# Patient Record
Sex: Female | Born: 1980 | Hispanic: No
Health system: Southern US, Community
[De-identification: ages and names within clinical notes are randomized; demographics above are authoritative.]

## PROBLEM LIST (undated history)

## (undated) DIAGNOSIS — R519 Headache, unspecified: Secondary | ICD-10-CM

## (undated) DIAGNOSIS — R51 Headache: Secondary | ICD-10-CM

## (undated) DIAGNOSIS — O24419 Gestational diabetes mellitus in pregnancy, unspecified control: Secondary | ICD-10-CM

## (undated) HISTORY — PX: APPENDECTOMY: SHX54

---

## 2014-11-07 ENCOUNTER — Emergency Department: Payer: Medicaid - Out of State

## 2014-11-07 ENCOUNTER — Encounter: Payer: Self-pay | Admitting: Medical Oncology

## 2014-11-07 ENCOUNTER — Emergency Department
Admission: EM | Admit: 2014-11-07 | Discharge: 2014-11-07 | Disposition: A | Discharge status: Home / Self Care | Payer: Medicaid - Out of State | Attending: Emergency Medicine | Admitting: Emergency Medicine

## 2014-11-07 DIAGNOSIS — R0600 Dyspnea, unspecified: Secondary | ICD-10-CM | POA: Insufficient documentation

## 2014-11-07 DIAGNOSIS — R1110 Vomiting, unspecified: Secondary | ICD-10-CM | POA: Insufficient documentation

## 2014-11-07 DIAGNOSIS — N3090 Cystitis, unspecified without hematuria: Secondary | ICD-10-CM | POA: Insufficient documentation

## 2014-11-07 DIAGNOSIS — R109 Unspecified abdominal pain: Secondary | ICD-10-CM | POA: Insufficient documentation

## 2014-11-07 DIAGNOSIS — R42 Dizziness and giddiness: Secondary | ICD-10-CM | POA: Diagnosis present

## 2014-11-07 DIAGNOSIS — Z3202 Encounter for pregnancy test, result negative: Secondary | ICD-10-CM | POA: Diagnosis not present

## 2014-11-07 LAB — CBC
HEMATOCRIT: 36.1 % (ref 35.0–47.0)
Hemoglobin: 11.9 g/dL — ABNORMAL LOW (ref 12.0–16.0)
MCH: 25.8 pg — ABNORMAL LOW (ref 26.0–34.0)
MCHC: 33.1 g/dL (ref 32.0–36.0)
MCV: 78.1 fL — ABNORMAL LOW (ref 80.0–100.0)
Platelets: 270 10*3/uL (ref 150–440)
RBC: 4.62 MIL/uL (ref 3.80–5.20)
RDW: 14.8 % — AB (ref 11.5–14.5)
WBC: 7.9 10*3/uL (ref 3.6–11.0)

## 2014-11-07 LAB — URINALYSIS COMPLETE WITH MICROSCOPIC (ARMC ONLY)
Bilirubin Urine: NEGATIVE
GLUCOSE, UA: NEGATIVE mg/dL
Ketones, ur: NEGATIVE mg/dL
LEUKOCYTES UA: NEGATIVE
NITRITE: NEGATIVE
PH: 6 (ref 5.0–8.0)
PROTEIN: NEGATIVE mg/dL
Specific Gravity, Urine: 1.016 (ref 1.005–1.030)

## 2014-11-07 LAB — COMPREHENSIVE METABOLIC PANEL
ALT: 13 U/L — ABNORMAL LOW (ref 14–54)
ANION GAP: 7 (ref 5–15)
AST: 20 U/L (ref 15–41)
Albumin: 4.1 g/dL (ref 3.5–5.0)
Alkaline Phosphatase: 64 U/L (ref 38–126)
BILIRUBIN TOTAL: 0.6 mg/dL (ref 0.3–1.2)
BUN: 12 mg/dL (ref 6–20)
CALCIUM: 9 mg/dL (ref 8.9–10.3)
CO2: 24 mmol/L (ref 22–32)
Chloride: 108 mmol/L (ref 101–111)
Creatinine, Ser: 0.53 mg/dL (ref 0.44–1.00)
GFR calc Af Amer: >60 mL/min (ref >60)
Glucose, Bld: 76 mg/dL (ref 65–99)
POTASSIUM: 4.5 mmol/L (ref 3.5–5.1)
Sodium: 139 mmol/L (ref 135–145)
TOTAL PROTEIN: 7.4 g/dL (ref 6.5–8.1)

## 2014-11-07 LAB — POCT PREGNANCY, URINE: PREG TEST UR: NEGATIVE

## 2014-11-07 LAB — LIPASE, BLOOD: Lipase: 27 U/L (ref 22–51)

## 2014-11-07 MED ORDER — PHENAZOPYRIDINE HCL 200 MG PO TABS
ORAL_TABLET | ORAL | Status: AC
  Administered 2014-11-07: 200 mg via ORAL
  Filled 2014-11-07: qty 1

## 2014-11-07 MED ORDER — PHENAZOPYRIDINE HCL 200 MG PO TABS: 200.0000 mg | ORAL_TABLET | Freq: Three times a day (TID) | ORAL | Status: AC | PRN

## 2014-11-07 MED ORDER — PHENAZOPYRIDINE HCL 200 MG PO TABS
200.0000 mg | ORAL_TABLET | Freq: Once | ORAL | Status: AC
  Administered 2014-11-07: 200 mg via ORAL

## 2014-11-07 MED ORDER — SULFAMETHOXAZOLE-TRIMETHOPRIM 800-160 MG PO TABS: 1.0000 | ORAL_TABLET | Freq: Two times a day (BID) | ORAL | Status: DC

## 2014-11-07 MED ORDER — OXYCODONE-ACETAMINOPHEN 5-325 MG PO TABS
2.0000 | ORAL_TABLET | Freq: Once | ORAL | Status: AC
  Administered 2014-11-07: 2 via ORAL
  Filled 2014-11-07: qty 2

## 2014-11-07 MED ORDER — PHENAZOPYRIDINE HCL 200 MG PO TABS
ORAL_TABLET | ORAL | Status: AC
  Filled 2014-11-07: qty 1

## 2014-11-07 MED ORDER — ONDANSETRON HCL 4 MG PO TABS
4.0000 mg | ORAL_TABLET | Freq: Once | ORAL | Status: AC
  Administered 2014-11-07: 4 mg via ORAL
  Filled 2014-11-07 (×2): qty 1

## 2014-11-07 NOTE — ED Notes (Signed)
Pt to triage with reports of lower abd pain, dizziness and vomiting that began yesterday. Pt also reports dysuria.

## 2014-11-07 NOTE — ED Provider Notes (Signed)
[facility] Emergency Department Provider Note     Time seen: ----------------------------------------- 7:31 PM on 11/07/2014 -----------------------------------------    I have reviewed the triage vital signs and the nursing notes.   HISTORY  Chief Complaint Dizziness and Emesis    HPI Stephanie Fischer is a 34 y.o. female who presents ER for lower abdominal pain, dizziness and vomiting that began yesterday. She also reports dysuria and inability to get her urine out. She denies fevers chills or other complaints.   History reviewed. No pertinent past medical history.  There are no active problems to display for this patient.   Past Surgical History  Procedure Laterality Date  . Appendectomy    . Cesarean section      Allergies Review of patient's allergies indicates no known allergies.  Social History Social History  Substance Use Topics  . Smoking status: Never Smoker   . Smokeless tobacco: None  . Alcohol Use: No    Review of Systems Constitutional: Negative for fever. Eyes: Negative for visual changes. ENT: Negative for sore throat. Cardiovascular: Negative for chest pain. Respiratory: Negative for shortness of breath. Gastrointestinal: Positive for abdominal pain, positive for vomiting Genitourinary: Positive for dysuria Musculoskeletal: Negative for back pain. Skin: Negative for rash. Neurological: Negative for headaches, focal weakness or numbness. Positive for dizziness  10-point ROS otherwise negative.  ____________________________________________   PHYSICAL EXAM:  VITAL SIGNS: ED Triage Vitals  Enc Vitals Group     BP 11/07/14 1830 113/75 mmHg     Pulse Rate 11/07/14 1830 96     Resp 11/07/14 1830 18     Temp 11/07/14 1830 97.8 F (36.6 C)     Temp Source 11/07/14 1830 Oral     SpO2 11/07/14 1830 97 %     Weight 11/07/14 1830 150 lb (68.04 kg)     Height 11/07/14 1830 5\' 2"  (1.575 m)     Head Cir --    Peak Flow --      Pain Score 11/07/14 1831 9     Pain Loc --      Pain Edu? --      Excl. in [place]? --     Constitutional: Alert and oriented. Mild distress Eyes: Conjunctivae are normal. PERRL. Normal extraocular movements. ENT   Head: Normocephalic and atraumatic.   Nose: No congestion/rhinnorhea.   Mouth/Throat: Mucous membranes are moist.   Neck: No stridor. Cardiovascular: Normal rate, regular rhythm. Normal and symmetric distal pulses are present in all extremities. No murmurs, rubs, or gallops. Respiratory: Normal respiratory effort without tachypnea nor retractions. Breath sounds are clear and equal bilaterally. No wheezes/rales/rhonchi. Gastrointestinal: Nonfocal tenderness, no rebound or guarding. Normal bowel sounds. Musculoskeletal: Nontender with normal range of motion in all extremities. No joint effusions.  No lower extremity tenderness nor edema. Neurologic:  Normal speech and language. No gross focal neurologic deficits are appreciated. Speech is normal. No gait instability. Skin:  Skin is warm, dry and intact. No rash noted. Psychiatric: Mood and affect are normal. Speech and behavior are normal. Patient exhibits appropriate insight and judgment.  ____________________________________________  ED COURSE:  Pertinent labs & imaging results that were available during my care of the patient were reviewed by me and considered in my medical decision making (see chart for details). Unclear etiology, will check abdominal labs and urine. ____________________________________________    LABS (pertinent positives/negatives)  Labs Reviewed  COMPREHENSIVE METABOLIC PANEL - Abnormal; Notable for the following:    ALT 13 (*)    All  other components within normal limits  CBC - Abnormal; Notable for the following:    Hemoglobin 11.9 (*)    MCV 78.1 (*)    MCH 25.8 (*)    RDW 14.8 (*)    All other components within normal limits  URINALYSIS COMPLETEWITH MICROSCOPIC  ([facility] ONLY) - Abnormal; Notable for the following:    Color, Urine STRAW (*)    APPearance CLEAR (*)    Hgb urine dipstick 2+ (*)    Bacteria, UA RARE (*)    Squamous Epithelial / LPF 0-5 (*)    All other components within normal limits  LIPASE, BLOOD  POC URINE PREG, ED  POCT PREGNANCY, URINE   IMPRESSION: No acute intra-abdominal or pelvic pathology. ____________________________________________  FINAL ASSESSMENT AND PLAN  Abdominal pain  Plan: Patient with labs and imaging as dictated above. Patient is in no acute distress, unclear etiology. Patient apparently having bladder spasms. Will discharge with a few days of Septra , send a urine culture and give Pyridium. She is stable for discharge   Earleen Newport, MD   Earleen Newport, MD 11/07/14 2131

## 2014-11-07 NOTE — ED Notes (Signed)
Bladder sca: 212 mL, Dr. Jimmye Norman notified.

## 2014-11-07 NOTE — Discharge Instructions (Signed)
Dizziness °Dizziness is a common problem. It is a feeling of unsteadiness or light-headedness. You may feel like you are about to faint. Dizziness can lead to injury if you stumble or fall. A person of any age group can suffer from dizziness, but dizziness is more common in older adults. °CAUSES  °Dizziness can be caused by many different things, including: °· Middle ear problems. °· Standing for too long. °· Infections. °· An allergic reaction. °· Aging. °· An emotional response to something, such as the sight of blood. °· Side effects of medicines. °· Tiredness. °· Problems with circulation or blood pressure. °· Excessive use of alcohol or medicines, or illegal drug use. °· Breathing too fast (hyperventilation). °· An irregular heart rhythm (arrhythmia). °· A low red blood cell count (anemia). °· Pregnancy. °· Vomiting, diarrhea, fever, or other illnesses that cause body fluid loss (dehydration). °· Diseases or conditions such as Parkinson's disease, high blood pressure (hypertension), diabetes, and thyroid problems. °· Exposure to extreme heat. °DIAGNOSIS  °Your health care provider will ask about your symptoms, perform a physical exam, and perform an electrocardiogram (ECG) to record the electrical activity of your heart. Your health care provider may also perform other heart or blood tests to determine the cause of your dizziness. These may include: °· Transthoracic echocardiogram (TTE). During echocardiography, sound waves are used to evaluate how blood flows through your heart. °· Transesophageal echocardiogram (TEE). °· Cardiac monitoring. This allows your health care provider to monitor your heart rate and rhythm in real time. °· Holter monitor. This is a portable device that records your heartbeat and can help diagnose heart arrhythmias. It allows your health care provider to track your heart activity for several days if needed. °· Stress tests by exercise or by giving medicine that makes the heart beat  faster. °TREATMENT  °Treatment of dizziness depends on the cause of your symptoms and can vary greatly. °HOME CARE INSTRUCTIONS  °· Drink enough fluids to keep your urine clear or pale yellow. This is especially important in very hot weather. In older adults, it is also important in cold weather. °· Take your medicine exactly as directed if your dizziness is caused by medicines. When taking blood pressure medicines, it is especially important to get up slowly. °· Rise slowly from chairs and steady yourself until you feel okay. °· In the morning, first sit up on the side of the bed. When you feel okay, stand slowly while holding onto something until you know your balance is fine. °· Move your legs often if you need to stand in one place for a long time. Tighten and relax your muscles in your legs while standing. °· Have someone stay with you for 1-2 days if dizziness continues to be a problem. Do this until you feel you are well enough to stay alone. Have the person call your health care provider if he or she notices changes in you that are concerning. °· Do not drive or use heavy machinery if you feel dizzy. °· Do not drink alcohol. °SEEK IMMEDIATE MEDICAL CARE IF:  °· Your dizziness or light-headedness gets worse. °· You feel nauseous or vomit. °· You have problems talking, walking, or using your arms, hands, or legs. °· You feel weak. °· You are not thinking clearly or you have trouble forming sentences. It may take a friend or family member to notice this. °· You have chest pain, abdominal pain, shortness of breath, or sweating. °· Your vision changes. °· You notice   any bleeding.  You have side effects from medicine that seems to be getting worse rather than better. MAKE SURE YOU:   Understand these instructions.  Will watch your condition.  Will get help right away if you are not doing well or get worse. Document Released: 08/12/2000 Document Revised: 02/21/2013 Document Reviewed: 09/05/2010 [facility]  Patient Information 2015 [place], [place]. This information is not intended to replace advice given to you by your health care provider. Make sure you discuss any questions you have with your health care provider.  Urinary Tract Infection Urinary tract infections (UTIs) can develop anywhere along your urinary tract. Your urinary tract is your body's drainage system for removing wastes and extra water. Your urinary tract includes two kidneys, two ureters, a bladder, and a urethra. Your kidneys are a pair of bean-shaped organs. Each kidney is about the size of your fist. They are located below your ribs, one on each side of your spine. CAUSES Infections are caused by microbes, which are microscopic organisms, including fungi, viruses, and bacteria. These organisms are so small that they can only be seen through a microscope. Bacteria are the microbes that most commonly cause UTIs. SYMPTOMS  Symptoms of UTIs may vary by age and gender of the patient and by the location of the infection. Symptoms in young women typically include a frequent and intense urge to urinate and a painful, burning feeling in the bladder or urethra during urination. Older women and men are more likely to be tired, shaky, and weak and have muscle aches and abdominal pain. A fever may mean the infection is in your kidneys. Other symptoms of a kidney infection include pain in your back or sides below the ribs, nausea, and vomiting. DIAGNOSIS To diagnose a UTI, your caregiver will ask you about your symptoms. Your caregiver also will ask to provide a urine sample. The urine sample will be tested for bacteria and white blood cells. White blood cells are made by your body to help fight infection. TREATMENT  Typically, UTIs can be treated with medication. Because most UTIs are caused by a bacterial infection, they usually can be treated with the use of antibiotics. The choice of antibiotic and length of treatment depend on your symptoms and  the type of bacteria causing your infection. HOME CARE INSTRUCTIONS  If you were prescribed antibiotics, take them exactly as your caregiver instructs you. Finish the medication even if you feel better after you have only taken some of the medication.  Drink enough water and fluids to keep your urine clear or pale yellow.  Avoid caffeine, tea, and carbonated beverages. They tend to irritate your bladder.  Empty your bladder often. Avoid holding urine for long periods of time.  Empty your bladder before and after sexual intercourse.  After a bowel movement, women should cleanse from front to back. Use each tissue only once. SEEK MEDICAL CARE IF:   You have back pain.  You develop a fever.  Your symptoms do not begin to resolve within 3 days. SEEK IMMEDIATE MEDICAL CARE IF:   You have severe back pain or lower abdominal pain.  You develop chills.  You have nausea or vomiting.  You have continued burning or discomfort with urination. MAKE SURE YOU:   Understand these instructions.  Will watch your condition.  Will get help right away if you are not doing well or get worse. Document Released: 11/26/2004 Document Revised: 08/18/2011 Document Reviewed: 03/27/2011 [facility] Patient Information 2015 [place], [place]. This information is not  intended to replace advice given to you by your health care provider. Make sure you discuss any questions you have with your health care provider. ° °

## 2014-11-07 NOTE — ED Notes (Signed)

## 2014-11-07 NOTE — ED Notes (Signed)
Patient presents to ED with c/o headache, dizziness, vomiting and lower abdominal pain. Reports dysuria and hematuria. + photosensitivity. Patient has history of headaches but states "this one is worse." Patient alert and oriented x 4, respirations even and unlabored.

## 2016-07-31 ENCOUNTER — Encounter: Payer: Self-pay | Admitting: *Deleted

## 2016-07-31 ENCOUNTER — Emergency Department
Admission: EM | Admit: 2016-07-31 | Discharge: 2016-07-31 | Disposition: A | Discharge status: Home / Self Care | Payer: Medicaid - Out of State | Attending: Emergency Medicine | Admitting: Emergency Medicine

## 2016-07-31 ENCOUNTER — Emergency Department: Payer: Medicaid - Out of State

## 2016-07-31 DIAGNOSIS — R51 Headache: Secondary | ICD-10-CM | POA: Insufficient documentation

## 2016-07-31 DIAGNOSIS — R519 Headache, unspecified: Secondary | ICD-10-CM

## 2016-07-31 HISTORY — DX: Headache: R51

## 2016-07-31 HISTORY — DX: Headache, unspecified: R51.9

## 2016-07-31 LAB — CBC WITH DIFFERENTIAL/PLATELET
BASOS ABS: 0 10*3/uL (ref 0–0.1)
BASOS PCT: 1 %
Eosinophils Absolute: 0.1 10*3/uL (ref 0–0.7)
Eosinophils Relative: 1 %
HEMATOCRIT: 35.6 % (ref 35.0–47.0)
Hemoglobin: 11.7 g/dL — ABNORMAL LOW (ref 12.0–16.0)
LYMPHS PCT: 31 %
Lymphs Abs: 2.5 10*3/uL (ref 1.0–3.6)
MCH: 25.5 pg — ABNORMAL LOW (ref 26.0–34.0)
MCHC: 32.9 g/dL (ref 32.0–36.0)
MCV: 77.4 fL — AB (ref 80.0–100.0)
Monocytes Absolute: 0.7 10*3/uL (ref 0.2–0.9)
Monocytes Relative: 9 %
NEUTROS ABS: 4.7 10*3/uL (ref 1.4–6.5)
NEUTROS PCT: 58 %
Platelets: 296 10*3/uL (ref 150–440)
RBC: 4.6 MIL/uL (ref 3.80–5.20)
RDW: 14.4 % (ref 11.5–14.5)
WBC: 8 10*3/uL (ref 3.6–11.0)

## 2016-07-31 LAB — HEPATIC FUNCTION PANEL
ALT: 14 U/L (ref 14–54)
AST: 26 U/L (ref 15–41)
Albumin: 4.3 g/dL (ref 3.5–5.0)
Alkaline Phosphatase: 49 U/L (ref 38–126)
TOTAL PROTEIN: 7.9 g/dL (ref 6.5–8.1)
Total Bilirubin: 0.8 mg/dL (ref 0.3–1.2)

## 2016-07-31 LAB — BASIC METABOLIC PANEL
Anion gap: 7 (ref 5–15)
BUN: 14 mg/dL (ref 6–20)
CALCIUM: 9.1 mg/dL (ref 8.9–10.3)
CO2: 27 mmol/L (ref 22–32)
CREATININE: 0.73 mg/dL (ref 0.44–1.00)
Chloride: 103 mmol/L (ref 101–111)
GFR calc Af Amer: >60 mL/min (ref >60)
GLUCOSE: 102 mg/dL — AB (ref 65–99)
POTASSIUM: 3.6 mmol/L (ref 3.5–5.1)
Sodium: 137 mmol/L (ref 135–145)

## 2016-07-31 MED ORDER — SODIUM CHLORIDE 0.9 % IV BOLUS (SEPSIS)
1000.0000 mL | Freq: Once | INTRAVENOUS | Status: AC
  Administered 2016-07-31: 1000 mL via INTRAVENOUS

## 2016-07-31 MED ORDER — PROCHLORPERAZINE EDISYLATE 5 MG/ML IJ SOLN
10.0000 mg | Freq: Once | INTRAMUSCULAR | Status: AC
  Administered 2016-07-31: 10 mg via INTRAVENOUS
  Filled 2016-07-31: qty 2

## 2016-07-31 MED ORDER — DIPHENHYDRAMINE HCL 50 MG/ML IJ SOLN
50.0000 mg | Freq: Once | INTRAMUSCULAR | Status: AC
  Administered 2016-07-31: 50 mg via INTRAVENOUS
  Filled 2016-07-31: qty 1

## 2016-07-31 MED ORDER — BUTALBITAL-APAP-CAFFEINE 50-325-40 MG PO TABS: 1.0000 | ORAL_TABLET | Freq: Four times a day (QID) | ORAL | 0 refills | Status: DC | PRN

## 2016-07-31 MED ORDER — SODIUM CHLORIDE 0.9 % IV SOLN: Freq: Once | INTRAVENOUS | Status: DC

## 2016-07-31 MED ORDER — KETOROLAC TROMETHAMINE 30 MG/ML IJ SOLN
30.0000 mg | Freq: Once | INTRAMUSCULAR | Status: AC
  Administered 2016-07-31: 30 mg via INTRAVENOUS
  Filled 2016-07-31: qty 1

## 2016-07-31 NOTE — ED Notes (Signed)
Information obtained via [facility] interpreter number [phone].

## 2016-07-31 NOTE — ED Triage Notes (Signed)
Pt c/o headache and R eye pain. Pt's R eye is bloodshot, she states it started when headache started. Pt c/o n/v x 3 since headache started. Pt does not seek medical care for her headaches which she states are frequent. Pt took ibuprofen 400 mg at onset of headache at 1400 today w/o relief.

## 2016-07-31 NOTE — ED Notes (Addendum)
Pt reports improvement in pain after medications that were given in triage. Pt states right sided headache that began today with right eye pain and right eye redness. Pt states "light hurts my eye". No drainage noted from right eye. Pt is able to ambulate and flex neck without difficulty.

## 2016-07-31 NOTE — ED Provider Notes (Signed)
[facility] Emergency Department Provider Note       Time seen: ----------------------------------------- 10:23 PM on 07/31/2016 -----------------------------------------     I have reviewed the triage vital signs and the nursing notes.   HISTORY   Chief Complaint Headache and Eye Pain    HPI Stephanie Fischer is a 36 y.o. female who presents to the ED for headache and right eye pain. Patient states she had a right-sided headache and the right eye looked bloodshot. She complains of nausea and vomiting 3 times since the headache started. She has a chronic history of headaches but it is usually not this intense. She took some ibuprofen prior to arrival. Pain is currently 5 out of 10 on the right side of her head.   Past Medical History:  Diagnosis Date  . Headache     There are no active problems to display for this patient.   Past Surgical History:  Procedure Laterality Date  . APPENDECTOMY    . CESAREAN SECTION      Allergies Patient has no known allergies.  Social History Social History  Substance Use Topics  . Smoking status: Never Smoker  . Smokeless tobacco: Never Used  . Alcohol use No    Review of Systems Constitutional: Negative for fever. Eyes: Negative for vision changes, Positive for right eye injection ENT:  Negative for congestion, sore throat Cardiovascular: Negative for chest pain. Respiratory: Negative for shortness of breath. Gastrointestinal: Negative for abdominal pain, vomiting and diarrhea. Genitourinary: Negative for dysuria. Musculoskeletal: Negative for back pain. Skin: Negative for rash. Neurological: Positive for headache  All systems negative/normal/unremarkable except as stated in the HPI  ____________________________________________   PHYSICAL EXAM:  VITAL SIGNS: ED Triage Vitals  Enc Vitals Group     BP 07/31/16 1938 116/86     Pulse Rate 07/31/16 1938 89     Resp 07/31/16 1938 (!) 22    Temp 07/31/16 1938 98.5 F (36.9 C)     Temp Source 07/31/16 1938 Oral     SpO2 07/31/16 1938 100 %     Weight 07/31/16 1940 150 lb (68 kg)     Height 07/31/16 1940 5\' 5"  (1.651 m)     Head Circumference --      Peak Flow --      Pain Score 07/31/16 1936 10     Pain Loc --      Pain Edu? --      Excl. in [place]? --     Constitutional: Alert and oriented. Well appearing and in no distress. Eyes: Mild conjunctival injection the right eye, Normal extraocular movements. ENT   Head: Normocephalic and atraumatic.   Nose: No congestion/rhinnorhea.   Mouth/Throat: Mucous membranes are moist.   Neck: No stridor. Cardiovascular: Normal rate, regular rhythm. No murmurs, rubs, or gallops. Respiratory: Normal respiratory effort without tachypnea nor retractions. Breath sounds are clear and equal bilaterally. No wheezes/rales/rhonchi. Gastrointestinal: Soft and nontender. Normal bowel sounds Musculoskeletal: Nontender with normal range of motion in extremities. No lower extremity tenderness nor edema. Neurologic:  Normal speech and language. No gross focal neurologic deficits are appreciated.  Skin:  Skin is warm, dry and intact. No rash noted. Psychiatric: Mood and affect are normal. Speech and behavior are normal.  ____________________________________________  ED COURSE:  Pertinent labs & imaging results that were available during my care of the patient were reviewed by me and considered in my medical decision making (see chart for details). Patient presents for headache, we will assess with  labs and imaging as indicated.   Procedures ____________________________________________   LABS (pertinent positives/negatives)  Labs Reviewed  BASIC METABOLIC PANEL - Abnormal; Notable for the following:       Result Value   Glucose, Bld 102 (*)    All other components within normal limits  HEPATIC FUNCTION PANEL - Abnormal; Notable for the following:    Bilirubin, Direct <0.1 (*)     All other components within normal limits  CBC WITH DIFFERENTIAL/PLATELET - Abnormal; Notable for the following:    Hemoglobin 11.7 (*)    MCV 77.4 (*)    MCH 25.5 (*)    All other components within normal limits    RADIOLOGY Images were viewed by me  CT head IMPRESSION: Normal head CT. No acute intracranial process identified. ____________________________________________  FINAL ASSESSMENT AND PLAN  Headache  Plan: Patient's labs and imaging were dictated above. Patient had presented for Headaches that were worse than normal. She has a benign neurologic exam this time with a negative CT and lab work. She'll be discharged with Fioricet and is encouraged to have close outpatient follow-up.   Earleen Newport, MD   Note: This note was generated in part or whole with voice recognition software. Voice recognition is usually quite accurate but there are transcription errors that can and very often do occur. I apologize for any typographical errors that were not detected and corrected.     Earleen Newport, MD 07/31/16 2257

## 2016-10-21 ENCOUNTER — Telehealth: Payer: Self-pay | Admitting: General Surgery

## 2016-10-21 ENCOUNTER — Encounter: Payer: Self-pay | Admitting: General Surgery

## 2016-10-21 NOTE — Telephone Encounter (Signed)
10-21-16 I HAVE L/M TO SEE IF SHE NEEDS AN INTERPETER FOR HER APPT.JANE AT DERMATOLOGY STATED NO,BUT OUR SYSTEM REFLECTS SHE DOSE/MTH

## 2016-10-28 ENCOUNTER — Encounter: Payer: Self-pay | Admitting: *Deleted

## 2016-11-04 ENCOUNTER — Ambulatory Visit: Payer: Self-pay | Admitting: General Surgery

## 2016-12-09 ENCOUNTER — Encounter: Payer: Self-pay | Admitting: *Deleted

## 2016-12-30 ENCOUNTER — Ambulatory Visit (INDEPENDENT_AMBULATORY_CARE_PROVIDER_SITE_OTHER): Payer: Commercial Managed Care - PPO | Admitting: General Surgery

## 2016-12-30 ENCOUNTER — Encounter: Payer: Self-pay | Admitting: *Deleted

## 2016-12-30 ENCOUNTER — Encounter: Payer: Self-pay | Admitting: General Surgery

## 2016-12-30 VITALS — BP 108/62 | HR 88 | Resp 12 | Ht 64.0 in | Wt 146.0 lb

## 2016-12-30 DIAGNOSIS — D171 Benign lipomatous neoplasm of skin and subcutaneous tissue of trunk: Secondary | ICD-10-CM | POA: Diagnosis not present

## 2016-12-30 NOTE — Progress Notes (Signed)
Patient ID: Stephanie Fischer, female   DOB: 03-27-1980, 36 y.o.   MRN: [id number]  Chief Complaint  Patient presents with  . Lipoma    HPI Stephanie Fischer is a 36 y.o. female.  Here for evaluaiton of a mid back lipoma. She states it has been there for about a year. It has gotten larger over the past 6 months. She states it started out jelly bean size and now it is plum size with some pain when sleeping on her back.  She is originally from [place]. Her husband is a Insurance underwriter and she works for [employer]  HPI  Past Medical History:  Diagnosis Date  . Headache     Past Surgical History:  Procedure Laterality Date  . APPENDECTOMY    . CESAREAN SECTION      No family history on file.  Social History Social History  Substance Use Topics  . Smoking status: Never Smoker  . Smokeless tobacco: Never Used  . Alcohol use No    No Known Allergies  Current Outpatient Prescriptions  Medication Sig Dispense Refill  . nitrofurantoin (MACRODANTIN) 100 MG capsule Take 100 mg by mouth 2 (two) times daily.    . Phenazopyridine HCl (URINARY PAIN RELIEF) 97.5 MG TABS Take by mouth daily.     No current facility-administered medications for this visit.     Review of Systems Review of Systems  Constitutional: Negative.   Respiratory: Negative.   Cardiovascular: Negative.     Blood pressure 108/62, pulse 88, resp. rate 12, height 5\' 4"  (1.626 m), weight 146 lb (66.2 kg), last menstrual period 12/04/2016.  Physical Exam Physical Exam  Constitutional: She is oriented to person, place, and time. She appears well-developed and well-nourished.  HENT:  Mouth/Throat: Oropharynx is clear and moist.  Eyes: Conjunctivae are normal. No scleral icterus.  Neck: Neck supple.  Cardiovascular: Normal rate, regular rhythm and normal heart sounds.   Pulmonary/Chest: Effort normal and breath sounds normal.      Lymphadenopathy:    She has no cervical adenopathy.  Neurological: She is alert and  oriented to person, place, and time.  Skin: Skin is warm and dry.  Left below scapula 4 cm lipoma. Right side over lying tip of scapula 7 x 9 cm lipoma  Psychiatric: Her behavior is normal.    Data Reviewed Dermatology notes from Dallie Dad under Kirkland Hun, M.D. from on [facility] dermatology  Assessment    Enlarging lipomas, symptomatic with local tenderness.    Plan    Patient has significant tenderness especially in the dominant lesion on the right. I think she would be best served with excision under local anesthesia with sedation.    Recommend excision both lipomas under Same Day Surgery  Out of work 1 week and Light duty for 1 week  HPI, Physical Exam, Assessment and Plan have been scribed under the direction and in the presence of Robert Bellow, MD. Karie Fetch, RN  I have completed the exam and reviewed the above documentation for accuracy and completeness.  I agree with the above.  Haematologist has been used and any errors in dictation or transcription are unintentional.  Hervey Ard, M.D., F.A.C.S.  Robert Bellow 12/30/2016, 8:32 PM  Patient's surgery has been scheduled for 01-14-17 at [facility].   Dominga Ferry, CMA

## 2016-12-30 NOTE — Patient Instructions (Addendum)
The patient is aware to call back for any questions or concerns.  Recommend excision of both lipomas under Same Day Surgery

## 2017-01-07 ENCOUNTER — Inpatient Hospital Stay
Admission: RE | Admit: 2017-01-07 | Discharge: 2017-01-07 | Disposition: A | Discharge status: Home / Self Care | Payer: Self-pay | Source: Ambulatory Visit

## 2017-01-07 ENCOUNTER — Telehealth: Payer: Self-pay | Admitting: *Deleted

## 2017-01-07 NOTE — Telephone Encounter (Signed)
Patient's husband called back and will call Pre Admit testing to reschedule.

## 2017-01-07 NOTE — Telephone Encounter (Signed)
Message left on cell phone for patient to call the office.   Per Caryl Pina in the Pre-admission Testing Department, patient was a no show for pre-admit appointment today.   We need to find out why she did not go and give her the number to call and get appointment rescheduled.   Patient is currently scheduled for surgery with Dr. Bary Castilla next Thursday, 01-14-17 at [facility].

## 2017-01-12 ENCOUNTER — Encounter
Admission: RE | Admit: 2017-01-12 | Discharge: 2017-01-12 | Disposition: A | Discharge status: Home / Self Care | Payer: Commercial Managed Care - PPO | Source: Ambulatory Visit | Attending: General Surgery | Admitting: General Surgery

## 2017-01-12 ENCOUNTER — Other Ambulatory Visit: Payer: Self-pay

## 2017-01-12 DIAGNOSIS — Z9889 Other specified postprocedural states: Secondary | ICD-10-CM | POA: Diagnosis not present

## 2017-01-12 DIAGNOSIS — D171 Benign lipomatous neoplasm of skin and subcutaneous tissue of trunk: Secondary | ICD-10-CM | POA: Diagnosis not present

## 2017-01-12 DIAGNOSIS — Z9049 Acquired absence of other specified parts of digestive tract: Secondary | ICD-10-CM | POA: Diagnosis not present

## 2017-01-12 HISTORY — DX: Gestational diabetes mellitus in pregnancy, unspecified control: O24.419

## 2017-01-12 LAB — CBC
HCT: 35.6 % (ref 35.0–47.0)
Hemoglobin: 11.4 g/dL — ABNORMAL LOW (ref 12.0–16.0)
MCH: 24.7 pg — AB (ref 26.0–34.0)
MCHC: 32 g/dL (ref 32.0–36.0)
MCV: 77.1 fL — AB (ref 80.0–100.0)
PLATELETS: 307 10*3/uL (ref 150–440)
RBC: 4.62 MIL/uL (ref 3.80–5.20)
RDW: 15 % — AB (ref 11.5–14.5)
WBC: 6.9 10*3/uL (ref 3.6–11.0)

## 2017-01-12 NOTE — Patient Instructions (Signed)
Your procedure is scheduled on: Thursday 01/14/17 Report to [place]. 2ND FLOOR MEDICAL MALL ENTRANCE. To find out your arrival time please call [phone] between 1PM - 3PM on Wednesday 01/13/17.  Remember: Instructions that are not followed completely may result in serious medical risk, up to and including death, or upon the discretion of your surgeon and anesthesiologist your surgery may need to be rescheduled.    __X__ 1. Do not eat anything after midnight the night before your    procedure.  No gum chewing or hard candies.  You may drink clear   liquids up to 2 hours before you are scheduled to arrive at the   hospital for your procedure. Do not drink clear liquids within 2   hours of scheduled arrival to the hospital as this may lead to your   procedure being delayed or rescheduled.       Clear liquids include:   Water or Apple juice without pulp   Clear carbohydrate beverage such as Clearfast or Gatorade   Black coffee or Clear Tea (no milk, no creamer, do not add anything   to the coffee or tea)    Diabetics should only drink water   __X__ 2. No Alcohol for 24 hours before or after surgery.   ____ 3. Bring all medications with you on the day of surgery if instructed.    __X__ 4. Notify your doctor if there is any change in your medical condition     (cold, fever, infections).             __X___5. No smoking within 24 hours of your surgery.     Do not wear jewelry, make-up, hairpins, clips or nail polish.  Do not wear lotions, powders, or perfumes.   Do not shave 48 hours prior to surgery. Men may shave face and neck.  Do not bring valuables to the hospital.    [facility] is not responsible for any belongings or valuables.               Contacts, dentures or bridgework may not be worn into surgery.  Leave your suitcase in the car. After surgery it may be brought to your room.  For patients admitted to the hospital, discharge time is determined by your                 treatment team.   Patients discharged the day of surgery will not be allowed to drive home.   Please read over the following fact sheets that you were given:   MRSA Information   ____ Take these medicines the morning of surgery with A SIP OF WATER:    1. none  2.   3.   4.  5.  6.  ____ Fleet Enema (as directed)   __X__ Use CHG Soap/SAGE wipes as directed  ____ Use inhalers on the day of surgery  ____ Stop metformin 2 days prior to surgery    ____ Take 1/2 of usual insulin dose the night before surgery and none on the morning of surgery.   ____ Stop Coumadin/Plavix/aspirin on   __X__ Stop Anti-inflammatories such as Advil, Aleve, Ibuprofen, Motrin, Naproxen, Naprosyn, Goodies,powder, or aspirin products.  OK to take Tylenol.   __X__ Stop supplements, Vitamin E, Fish Oil until after surgery.    ____ Bring C-Pap to the hospital.

## 2017-01-14 ENCOUNTER — Encounter: Payer: Self-pay | Admitting: *Deleted

## 2017-01-14 ENCOUNTER — Ambulatory Visit: Payer: Commercial Managed Care - PPO | Admitting: Anesthesiology

## 2017-01-14 ENCOUNTER — Ambulatory Visit
Admission: RE | Admit: 2017-01-14 | Discharge: 2017-01-14 | Disposition: A | Discharge status: Home / Self Care | Payer: Commercial Managed Care - PPO | Source: Ambulatory Visit | Attending: General Surgery | Admitting: General Surgery

## 2017-01-14 ENCOUNTER — Encounter
Admission: RE | Disposition: A | Discharge status: Home / Self Care | Payer: Self-pay | Source: Ambulatory Visit | Attending: General Surgery

## 2017-01-14 ENCOUNTER — Other Ambulatory Visit: Payer: Self-pay

## 2017-01-14 DIAGNOSIS — D171 Benign lipomatous neoplasm of skin and subcutaneous tissue of trunk: Secondary | ICD-10-CM | POA: Diagnosis not present

## 2017-01-14 DIAGNOSIS — Z9889 Other specified postprocedural states: Secondary | ICD-10-CM | POA: Insufficient documentation

## 2017-01-14 DIAGNOSIS — Z9049 Acquired absence of other specified parts of digestive tract: Secondary | ICD-10-CM | POA: Insufficient documentation

## 2017-01-14 HISTORY — PX: LIPOMA EXCISION: SHX5283

## 2017-01-14 LAB — POCT PREGNANCY, URINE: Preg Test, Ur: NEGATIVE

## 2017-01-14 SURGERY — EXCISION LIPOMA
Anesthesia: General | Laterality: Bilateral

## 2017-01-14 MED ORDER — SODIUM BICARBONATE 4 % IV SOLN
INTRAVENOUS | Status: DC | PRN
  Administered 2017-01-14: 65 mL via INTRAMUSCULAR

## 2017-01-14 MED ORDER — OXYCODONE HCL 5 MG PO TABS
5.0000 mg | ORAL_TABLET | Freq: Once | ORAL | Status: AC | PRN
  Administered 2017-01-14: 5 mg via ORAL

## 2017-01-14 MED ORDER — MIDAZOLAM HCL 2 MG/2ML IJ SOLN
INTRAMUSCULAR | Status: DC | PRN
  Administered 2017-01-14: 2 mg via INTRAVENOUS

## 2017-01-14 MED ORDER — FENTANYL CITRATE (PF) 100 MCG/2ML IJ SOLN
INTRAMUSCULAR | Status: DC | PRN
  Administered 2017-01-14: 50 ug via INTRAVENOUS
  Administered 2017-01-14 (×2): 25 ug via INTRAVENOUS

## 2017-01-14 MED ORDER — FENTANYL CITRATE (PF) 100 MCG/2ML IJ SOLN
INTRAMUSCULAR | Status: AC
  Filled 2017-01-14: qty 2

## 2017-01-14 MED ORDER — SODIUM BICARBONATE 4 % IV SOLN
INTRAVENOUS | Status: AC
  Filled 2017-01-14: qty 5

## 2017-01-14 MED ORDER — BUPIVACAINE HCL (PF) 0.5 % IJ SOLN
INTRAMUSCULAR | Status: AC
  Filled 2017-01-14: qty 30

## 2017-01-14 MED ORDER — FENTANYL CITRATE (PF) 100 MCG/2ML IJ SOLN: 25.0000 ug | INTRAMUSCULAR | Status: DC | PRN

## 2017-01-14 MED ORDER — FAMOTIDINE 20 MG PO TABS
20.0000 mg | ORAL_TABLET | Freq: Once | ORAL | Status: AC
  Administered 2017-01-14: 20 mg via ORAL

## 2017-01-14 MED ORDER — LACTATED RINGERS IV SOLN
INTRAVENOUS | Status: DC
  Administered 2017-01-14: 12:00:00 via INTRAVENOUS

## 2017-01-14 MED ORDER — PROPOFOL 10 MG/ML IV BOLUS
INTRAVENOUS | Status: AC
  Filled 2017-01-14: qty 20

## 2017-01-14 MED ORDER — OXYCODONE HCL 5 MG/5ML PO SOLN: 5.0000 mg | Freq: Once | ORAL | Status: AC | PRN

## 2017-01-14 MED ORDER — OXYCODONE HCL 5 MG PO TABS
ORAL_TABLET | ORAL | Status: AC
  Administered 2017-01-14: 5 mg via ORAL
  Filled 2017-01-14: qty 1

## 2017-01-14 MED ORDER — LIDOCAINE-EPINEPHRINE 1 %-1:100000 IJ SOLN
INTRAMUSCULAR | Status: AC
  Filled 2017-01-14: qty 1

## 2017-01-14 MED ORDER — FAMOTIDINE 20 MG PO TABS
ORAL_TABLET | ORAL | Status: AC
  Filled 2017-01-14: qty 1

## 2017-01-14 MED ORDER — PROPOFOL 10 MG/ML IV BOLUS
INTRAVENOUS | Status: DC | PRN
  Administered 2017-01-14 (×3): 20 mg via INTRAVENOUS
  Administered 2017-01-14: 10 mg via INTRAVENOUS
  Administered 2017-01-14 (×2): 20 mg via INTRAVENOUS

## 2017-01-14 MED ORDER — ONDANSETRON HCL 4 MG/2ML IJ SOLN
INTRAMUSCULAR | Status: DC | PRN
  Administered 2017-01-14: 4 mg via INTRAVENOUS

## 2017-01-14 MED ORDER — ONDANSETRON HCL 4 MG/2ML IJ SOLN
INTRAMUSCULAR | Status: AC
  Filled 2017-01-14: qty 2

## 2017-01-14 MED ORDER — HYDROCODONE-ACETAMINOPHEN 5-325 MG PO TABS: 1.0000 | ORAL_TABLET | ORAL | 0 refills | Status: AC | PRN

## 2017-01-14 MED ORDER — MIDAZOLAM HCL 2 MG/2ML IJ SOLN
INTRAMUSCULAR | Status: AC
  Filled 2017-01-14: qty 2

## 2017-01-14 SURGICAL SUPPLY — 37 items
BENZOIN TINCTURE PRP APPL 2/3 (GAUZE/BANDAGES/DRESSINGS) ×3 IMPLANT
BLADE SURG 15 STRL SS SAFETY (BLADE) ×3 IMPLANT
CANISTER SUCT 1200ML W/VALVE (MISCELLANEOUS) ×3 IMPLANT
CHLORAPREP W/TINT 26ML (MISCELLANEOUS) ×3 IMPLANT
CLOSURE WOUND 1/2 X4 (GAUZE/BANDAGES/DRESSINGS) ×1
DRAPE LAPAROTOMY 100X77 ABD (DRAPES) ×3 IMPLANT
DRSG TEGADERM 4X4.75 (GAUZE/BANDAGES/DRESSINGS) ×6 IMPLANT
DRSG TELFA 4X3 1S NADH ST (GAUZE/BANDAGES/DRESSINGS) ×6 IMPLANT
ELECT CAUTERY BLADE TIP 2.5 (TIP) ×3
ELECT REM PT RETURN 9FT ADLT (ELECTROSURGICAL) ×3
ELECTRODE CAUTERY BLDE TIP 2.5 (TIP) ×1 IMPLANT
ELECTRODE REM PT RTRN 9FT ADLT (ELECTROSURGICAL) ×1 IMPLANT
GAUZE SPONGE 4X4 12PLY STRL (GAUZE/BANDAGES/DRESSINGS) ×3 IMPLANT
GLOVE BIO SURGEON STRL SZ 6.5 (GLOVE) ×2 IMPLANT
GLOVE BIO SURGEON STRL SZ7.5 (GLOVE) ×3 IMPLANT
GLOVE BIO SURGEONS STRL SZ 6.5 (GLOVE) ×1
GLOVE BIOGEL PI IND STRL 6.5 (GLOVE) ×2 IMPLANT
GLOVE BIOGEL PI INDICATOR 6.5 (GLOVE) ×4
GLOVE INDICATOR 8.0 STRL GRN (GLOVE) ×3 IMPLANT
GOWN STRL REUS W/ TWL LRG LVL3 (GOWN DISPOSABLE) ×2 IMPLANT
GOWN STRL REUS W/TWL LRG LVL3 (GOWN DISPOSABLE) ×4
KIT RM TURNOVER STRD PROC AR (KITS) ×3 IMPLANT
LABEL OR SOLS (LABEL) ×3 IMPLANT
NDL SAFETY 25GX1.5 (NEEDLE) ×3 IMPLANT
NEEDLE HYPO 25GX1X1/2 BEV (NEEDLE) ×3 IMPLANT
PACK BASIN MINOR ARMC (MISCELLANEOUS) ×3 IMPLANT
STRIP CLOSURE SKIN 1/2X4 (GAUZE/BANDAGES/DRESSINGS) ×2 IMPLANT
SUT ETHILON 4-0 (SUTURE)
SUT ETHILON 4-0 FS2 18XMFL BLK (SUTURE)
SUT VIC AB 2-0 CT1 (SUTURE) ×3 IMPLANT
SUT VIC AB 3-0 SH 27 (SUTURE) ×2
SUT VIC AB 3-0 SH 27X BRD (SUTURE) ×1 IMPLANT
SUT VIC AB 4-0 FS2 27 (SUTURE) ×3 IMPLANT
SUT VICRYL+ 3-0 144IN (SUTURE) ×3 IMPLANT
SUTURE ETHLN 4-0 FS2 18XMF BLK (SUTURE) IMPLANT
SWABSTK COMLB BENZOIN TINCTURE (MISCELLANEOUS) ×3 IMPLANT
SYR CONTROL 10ML (SYRINGE) ×3 IMPLANT

## 2017-01-14 NOTE — Anesthesia Post-op Follow-up Note (Signed)
Anesthesia QCDR form completed.        

## 2017-01-14 NOTE — Discharge Instructions (Signed)
AMBULATORY SURGERY  °DISCHARGE INSTRUCTIONS ° ° °1) The drugs that you were given will stay in your system until tomorrow so for the next 24 hours you should not: ° °A) Drive an automobile °B) Make any legal decisions °C) Drink any alcoholic beverage ° ° °2) You may resume regular meals tomorrow.  Today it is better to start with liquids and gradually work up to solid foods. ° °You may eat anything you prefer, but it is better to start with liquids, then soup and crackers, and gradually work up to solid foods. ° ° °3) Please notify your doctor immediately if you have any unusual bleeding, trouble breathing, redness and pain at the surgery site, drainage, fever, or pain not relieved by medication. ° ° ° °4) Additional Instructions: ° ° ° ° ° ° ° °Please contact your physician with any problems or Same Day Surgery at [phone], Monday through Friday 6 am to 4 pm, or [place] [facility] [place] Main number at [phone].AMBULATORY SURGERY  ° °

## 2017-01-14 NOTE — Anesthesia Procedure Notes (Signed)
Procedure Name: MAC Performed by: Justus Memory, CRNA Pre-anesthesia Checklist: Patient identified, Emergency Drugs available, Suction available, Patient being monitored and Timeout performed Oxygen Delivery Method: Nasal cannula

## 2017-01-14 NOTE — H&P (Signed)
No change in clinical history or exam.  For excision of bilateral back lipomas.

## 2017-01-14 NOTE — Transfer of Care (Signed)
Immediate Anesthesia Transfer of Care Note  Patient: Stephanie Fischer  Procedure(s) Performed: EXCISION LIPOMA (Bilateral )  Patient Location: PACU  Anesthesia Type:General  Level of Consciousness: awake  Airway & Oxygen Therapy: Patient Spontanous Breathing and Patient connected to nasal cannula oxygen  Post-op Assessment: Report given to RN and Post -op Vital signs reviewed and stable  Post vital signs: Reviewed and stable  Last Vitals:  Vitals:   01/14/17 1150  BP: 107/64  Pulse: 84  Resp: 18  Temp: 36.6 C  SpO2: 100%    Last Pain:  Vitals:   01/14/17 1150  TempSrc: Tympanic         Complications: No apparent anesthesia complications

## 2017-01-14 NOTE — Op Note (Signed)
Preoperative diagnosis: Bilateral back lipomas.  Postoperative diagnosis: Same.  Operative procedure: Excision of bilateral back lipomas.  Operating Surgeon: Hervey Ard, MD.  Anesthesia: Attended local, 60 cc 0.5% Xylocaine with 0.25% Marcaine with 1-200,000 units of epinephrine.  With 5 cc Neut.  Estimated blood loss: Less than 5 cc.  Clinical note: This 36 year old woman has developed 2 lipomas on the back on the right side measuring approximately 7 cm and on the left approximately 4.  These become increasingly symptomatic with direct pressure and she was admitted for elective excision.  Operative note: The surgical site had been marked prior to presentation to the operating theater.  The patient was placed prone on the operating table and appropriately padded.  She underwent sedation which she tolerated well.  The surgical sites were closed cleaned with ChloraPrep and draped.  The above-mentioned local anesthetic was infiltrated and well-tolerated.  The right back lesion was approached first.  A transverse incision was made over the lesion and carried down through skin subtendinous tissue.  The superficial fascia was opened exposing the lipoma.  This was dissected free from the underlying muscle fascia.  Hemostasis was with electrocautery.  The deep tissue was approximated with a running 2-0 Vicryl suture including small bites of the fascia to minimize dead space.  The skin was closed with a running 4-0 Vicryl septic with suture.  The left back lesion was approached in a similar fashion.  This was a smaller lesion and an approximately 4 cm incision was utilized.  It was dissected from the underlying muscle fascia as noted above.  The wound was closed in layers with 2-0 Vicryl and 4-0 Vicryl sutures as described above.  Benzoin, Steri-Strips, Telfa and Tegaderm dressings were applied.  The patient tolerated the procedure well and was taken to recovery room in stable condition.

## 2017-01-14 NOTE — Anesthesia Postprocedure Evaluation (Signed)
Anesthesia Post Note  Patient: Stephanie Fischer  Procedure(s) Performed: EXCISION LIPOMA (Bilateral )  Patient location during evaluation: PACU Anesthesia Type: General Level of consciousness: awake and alert Pain management: pain level controlled Vital Signs Assessment: post-procedure vital signs reviewed and stable Respiratory status: spontaneous breathing, nonlabored ventilation, respiratory function stable and patient connected to nasal cannula oxygen Cardiovascular status: blood pressure returned to baseline and stable Postop Assessment: no apparent nausea or vomiting Anesthetic complications: no     Last Vitals:  Vitals:   01/14/17 1431 01/14/17 1525  BP: (!) 89/54 (!) 101/59  Pulse: 71 77  Resp: 15 15  Temp: 36.4 C   SpO2: 100% 100%    Last Pain:  Vitals:   01/14/17 1431  TempSrc: Temporal  PainSc: 5                  Precious Haws Piscitello

## 2017-01-14 NOTE — Anesthesia Preprocedure Evaluation (Addendum)
Anesthesia Evaluation  Patient identified by MRN, date of birth, ID band Patient awake    Reviewed: Allergy & Precautions, H&P , NPO status , Patient's Chart, lab work & pertinent test results  History of Anesthesia Complications Negative for: history of anesthetic complications  Airway Mallampati: II  TM Distance: <3 FB Neck ROM: full    Dental  (+) Chipped   Pulmonary neg pulmonary ROS, neg shortness of breath,           Cardiovascular Exercise Tolerance: Good (-) angina(-) Past MI and (-) DOE negative cardio ROS       Neuro/Psych  Headaches, negative psych ROS   GI/Hepatic negative GI ROS, Neg liver ROS, neg GERD  ,  Endo/Other  diabetes, Type 2  Renal/GU negative Renal ROS  negative genitourinary   Musculoskeletal   Abdominal   Peds  Hematology negative hematology ROS (+)   Anesthesia Other Findings Past Medical History: No date: Gestational diabetes No date: Gestational diabetes No date: Headache  Past Surgical History: No date: APPENDECTOMY No date: CESAREAN SECTION  BMI    Body Mass Index:  26.70 kg/m      Reproductive/Obstetrics negative OB ROS                             Anesthesia Physical Anesthesia Plan  ASA: II  Anesthesia Plan: General   Post-op Pain Management:    Induction: Intravenous  PONV Risk Score and Plan:   Airway Management Planned: Natural Airway and Nasal Cannula  Additional Equipment:   Intra-op Plan:   Post-operative Plan:   Informed Consent: I have reviewed the patients History and Physical, chart, labs and discussed the procedure including the risks, benefits and alternatives for the proposed anesthesia with the patient or authorized representative who has indicated his/her understanding and acceptance.   Dental Advisory Given  Plan Discussed with: Anesthesiologist, CRNA and Surgeon  Anesthesia Plan Comments: (Consent via  interpreter   Patient consented for risks of anesthesia including but not limited to:  - adverse reactions to medications - risk of intubation if required - damage to teeth, lips or other oral mucosa - sore throat or hoarseness - Damage to heart, brain, lungs or loss of life  Patient voiced understanding.)       Anesthesia Quick Evaluation

## 2017-01-15 LAB — SURGICAL PATHOLOGY

## 2017-01-19 ENCOUNTER — Ambulatory Visit (INDEPENDENT_AMBULATORY_CARE_PROVIDER_SITE_OTHER): Payer: Commercial Managed Care - PPO | Admitting: General Surgery

## 2017-01-19 ENCOUNTER — Encounter: Payer: Self-pay | Admitting: General Surgery

## 2017-01-19 ENCOUNTER — Ambulatory Visit: Payer: Commercial Managed Care - PPO

## 2017-01-19 VITALS — BP 98/62 | HR 84 | Resp 12 | Ht 62.0 in | Wt 147.0 lb

## 2017-01-19 DIAGNOSIS — D171 Benign lipomatous neoplasm of skin and subcutaneous tissue of trunk: Secondary | ICD-10-CM

## 2017-01-19 NOTE — Progress Notes (Signed)
tient to Patient ID: Stephanie Fischer, female   DOB: 1980-11-14, 36 y.o.   MRN: [id number]  Chief Complaint  Patient presents with  . Routine Post Op    HPI Laurieanne Galloway is a 36 y.o. female here today for her post op excision lipoma done on 01/14/2017. Patient states she is doing well. She states it is itching.  HPI  Past Medical History:  Diagnosis Date  . Gestational diabetes   . Gestational diabetes   . Headache     Past Surgical History:  Procedure Laterality Date  . APPENDECTOMY    . CESAREAN SECTION    . EXCISION LIPOMA Bilateral 01/14/2017   Performed by Robert Bellow, MD at [facility]    History reviewed. No pertinent family history.  Social History Social History   Tobacco Use  . Smoking status: Never Smoker  . Smokeless tobacco: Never Used  Substance Use Topics  . Alcohol use: No  . Drug use: No    No Known Allergies  Current Outpatient Medications  Medication Sig Dispense Refill  . HYDROcodone-acetaminophen (NORCO/VICODIN) 5-325 MG tablet Take 1 tablet every 4 (four) hours as needed by mouth for moderate pain. 20 tablet 0   No current facility-administered medications for this visit.     Review of Systems Review of Systems  Constitutional: Negative.   Respiratory: Negative.   Cardiovascular: Negative.   Neurological: Positive for headaches.    Blood pressure 98/62, pulse 84, resp. rate 12, height 5\' 2"  (1.575 m), weight 147 lb (66.7 kg), last menstrual period 01/01/2017, SpO2 100 %.  Physical Exam Physical Exam  Constitutional: She is oriented to person, place, and time. She appears well-developed and well-nourished.  Musculoskeletal:       Back:  Neurological: She is alert and oriented to person, place, and time.  Skin: Skin is warm and dry.    Data Reviewed DIAGNOSIS:  A. LIPOMA, RIGHT BACK; EXCISION:  - MATURE ADIPOSE TISSUE CONSISTENT WITH LIPOMA.   B. LIPOMA, LEFT BACK; EXCISION:  - MATURE ADIPOSE TISSUE  CONSISTENT WITH LIPOMA. DIAGNOSIS:  A. LIPOMA, RIGHT BACK; EXCISION:    Assessment    Bilateral back lipomas, healing well.    Plan    The patient was reassured that the pain lateral to the left lipoma excision site will resolve.    Patient may return to work on 01/25/2017. Return as needed.   HPI, Physical Exam, Assessment and Plan have been scribed under the direction and in the presence of Hervey Ard, MD.  Gaspar Cola, CMA   I have completed the exam and reviewed the above documentation for accuracy and completeness.  I agree with the above.  Haematologist has been used and any errors in dictation or transcription are unintentional.  Hervey Ard, M.D., F.A.C.S.  Robert Bellow 01/19/2017, 11:21 AM

## 2017-01-19 NOTE — Patient Instructions (Addendum)
Patient may return to work on 01/25/2017. Return as needed.

## 2017-06-15 ENCOUNTER — Other Ambulatory Visit: Payer: Self-pay | Admitting: Physician Assistant

## 2017-06-15 DIAGNOSIS — H9312 Tinnitus, left ear: Secondary | ICD-10-CM

## 2017-06-18 ENCOUNTER — Ambulatory Visit
Admission: RE | Admit: 2017-06-18 | Discharge: 2017-06-18 | Disposition: A | Discharge status: Home / Self Care | Payer: Commercial Managed Care - PPO | Source: Ambulatory Visit | Attending: Physician Assistant | Admitting: Physician Assistant

## 2017-06-18 DIAGNOSIS — H9312 Tinnitus, left ear: Secondary | ICD-10-CM | POA: Insufficient documentation

## 2017-06-18 DIAGNOSIS — H9042 Sensorineural hearing loss, unilateral, left ear, with unrestricted hearing on the contralateral side: Secondary | ICD-10-CM | POA: Insufficient documentation

## 2017-06-24 ENCOUNTER — Other Ambulatory Visit: Payer: Self-pay | Admitting: Obstetrics and Gynecology

## 2017-06-24 DIAGNOSIS — Z1231 Encounter for screening mammogram for malignant neoplasm of breast: Secondary | ICD-10-CM

## 2017-06-28 ENCOUNTER — Ambulatory Visit
Admission: RE | Admit: 2017-06-28 | Discharge: 2017-06-28 | Disposition: A | Discharge status: Home / Self Care | Payer: Commercial Managed Care - PPO | Source: Ambulatory Visit | Attending: Obstetrics and Gynecology | Admitting: Obstetrics and Gynecology

## 2017-06-28 DIAGNOSIS — R928 Other abnormal and inconclusive findings on diagnostic imaging of breast: Secondary | ICD-10-CM | POA: Insufficient documentation

## 2017-06-28 DIAGNOSIS — Z1231 Encounter for screening mammogram for malignant neoplasm of breast: Secondary | ICD-10-CM | POA: Diagnosis present

## 2017-07-07 ENCOUNTER — Other Ambulatory Visit: Payer: Self-pay | Admitting: Obstetrics and Gynecology

## 2017-07-07 DIAGNOSIS — N6489 Other specified disorders of breast: Secondary | ICD-10-CM

## 2017-07-07 DIAGNOSIS — R928 Other abnormal and inconclusive findings on diagnostic imaging of breast: Secondary | ICD-10-CM

## 2017-07-13 ENCOUNTER — Ambulatory Visit: Payer: Commercial Managed Care - PPO

## 2017-07-13 ENCOUNTER — Other Ambulatory Visit: Payer: Commercial Managed Care - PPO

## 2017-10-13 ENCOUNTER — Other Ambulatory Visit: Payer: Commercial Managed Care - PPO

## 2017-10-13 ENCOUNTER — Ambulatory Visit: Admission: RE | Admit: 2017-10-13 | Payer: Commercial Managed Care - PPO | Source: Ambulatory Visit

## 2018-07-14 ENCOUNTER — Emergency Department
Admission: EM | Admit: 2018-07-14 | Discharge: 2018-07-15 | Disposition: A | Discharge status: Home / Self Care | Payer: Medicaid Other | Attending: Emergency Medicine | Admitting: Emergency Medicine

## 2018-07-14 DIAGNOSIS — Y92009 Unspecified place in unspecified non-institutional (private) residence as the place of occurrence of the external cause: Secondary | ICD-10-CM | POA: Insufficient documentation

## 2018-07-14 DIAGNOSIS — Y9389 Activity, other specified: Secondary | ICD-10-CM | POA: Insufficient documentation

## 2018-07-14 DIAGNOSIS — S61411A Laceration without foreign body of right hand, initial encounter: Secondary | ICD-10-CM | POA: Insufficient documentation

## 2018-07-14 DIAGNOSIS — S0990XA Unspecified injury of head, initial encounter: Secondary | ICD-10-CM | POA: Diagnosis present

## 2018-07-14 DIAGNOSIS — S60221A Contusion of right hand, initial encounter: Secondary | ICD-10-CM | POA: Diagnosis not present

## 2018-07-14 DIAGNOSIS — S51811A Laceration without foreign body of right forearm, initial encounter: Secondary | ICD-10-CM | POA: Insufficient documentation

## 2018-07-14 DIAGNOSIS — Y998 Other external cause status: Secondary | ICD-10-CM | POA: Diagnosis not present

## 2018-07-14 DIAGNOSIS — S41111A Laceration without foreign body of right upper arm, initial encounter: Secondary | ICD-10-CM | POA: Diagnosis not present

## 2018-07-14 DIAGNOSIS — Z1159 Encounter for screening for other viral diseases: Secondary | ICD-10-CM | POA: Insufficient documentation

## 2018-07-14 DIAGNOSIS — S3991XA Unspecified injury of abdomen, initial encounter: Secondary | ICD-10-CM | POA: Insufficient documentation

## 2018-07-14 DIAGNOSIS — T07XXXA Unspecified multiple injuries, initial encounter: Secondary | ICD-10-CM

## 2018-07-14 DIAGNOSIS — Z23 Encounter for immunization: Secondary | ICD-10-CM | POA: Insufficient documentation

## 2018-07-14 DIAGNOSIS — I10 Essential (primary) hypertension: Secondary | ICD-10-CM | POA: Diagnosis not present

## 2018-07-14 DIAGNOSIS — S41011A Laceration without foreign body of right shoulder, initial encounter: Secondary | ICD-10-CM | POA: Insufficient documentation

## 2018-07-14 DIAGNOSIS — R52 Pain, unspecified: Secondary | ICD-10-CM | POA: Diagnosis not present

## 2018-07-14 DIAGNOSIS — R58 Hemorrhage, not elsewhere classified: Secondary | ICD-10-CM | POA: Diagnosis not present

## 2018-07-14 DIAGNOSIS — Z03818 Encounter for observation for suspected exposure to other biological agents ruled out: Secondary | ICD-10-CM | POA: Diagnosis not present

## 2018-07-14 DIAGNOSIS — M79603 Pain in arm, unspecified: Secondary | ICD-10-CM | POA: Diagnosis not present
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
## 2018-07-15 ENCOUNTER — Other Ambulatory Visit: Payer: Self-pay

## 2018-07-15 ENCOUNTER — Emergency Department: Payer: Medicaid Other

## 2018-07-15 LAB — CBC WITH DIFFERENTIAL/PLATELET
Abs Immature Granulocytes: 0.05 10*3/uL (ref 0.00–0.07)
Basophils Absolute: 0 10*3/uL (ref 0.0–0.1)
Basophils Relative: 0 %
Eosinophils Absolute: 0 10*3/uL (ref 0.0–0.5)
Eosinophils Relative: 0 %
HCT: 35.1 % — ABNORMAL LOW (ref 36.0–46.0)
Hemoglobin: 11.3 g/dL — ABNORMAL LOW (ref 12.0–15.0)
Immature Granulocytes: 1 %
Lymphocytes Relative: 15 %
Lymphs Abs: 1.7 10*3/uL (ref 0.7–4.0)
MCH: 24.1 pg — ABNORMAL LOW (ref 26.0–34.0)
MCHC: 32.2 g/dL (ref 30.0–36.0)
MCV: 74.8 fL — ABNORMAL LOW (ref 80.0–100.0)
Monocytes Absolute: 1 10*3/uL (ref 0.1–1.0)
Monocytes Relative: 9 %
Neutro Abs: 8.3 10*3/uL — ABNORMAL HIGH (ref 1.7–7.7)
Neutrophils Relative %: 75 %
Platelets: 363 10*3/uL (ref 150–400)
RBC: 4.69 MIL/uL (ref 3.87–5.11)
RDW: 14.8 % (ref 11.5–15.5)
WBC: 11.1 10*3/uL — ABNORMAL HIGH (ref 4.0–10.5)
nRBC: 0 % (ref 0.0–0.2)

## 2018-07-15 LAB — URINALYSIS, COMPLETE (UACMP) WITH MICROSCOPIC
Bacteria, UA: NONE SEEN
Bilirubin Urine: NEGATIVE
Glucose, UA: NEGATIVE mg/dL
Hgb urine dipstick: NEGATIVE
Ketones, ur: 80 mg/dL — AB
Nitrite: NEGATIVE
Protein, ur: NEGATIVE mg/dL
Specific Gravity, Urine: >1.046 — ABNORMAL HIGH (ref 1.005–1.030)
pH: 6 (ref 5.0–8.0)

## 2018-07-15 LAB — COMPREHENSIVE METABOLIC PANEL
ALT: 17 U/L (ref 0–44)
AST: 29 U/L (ref 15–41)
Albumin: 4.3 g/dL (ref 3.5–5.0)
Alkaline Phosphatase: 68 U/L (ref 38–126)
Anion gap: 11 (ref 5–15)
BUN: 10 mg/dL (ref 6–20)
CO2: 20 mmol/L — ABNORMAL LOW (ref 22–32)
Calcium: 8.7 mg/dL — ABNORMAL LOW (ref 8.9–10.3)
Chloride: 103 mmol/L (ref 98–111)
Creatinine, Ser: 0.53 mg/dL (ref 0.44–1.00)
GFR calc Af Amer: >60 mL/min (ref >60)
GFR calc non Af Amer: >60 mL/min (ref >60)
Glucose, Bld: 115 mg/dL — ABNORMAL HIGH (ref 70–99)
Potassium: 3.4 mmol/L — ABNORMAL LOW (ref 3.5–5.1)
Sodium: 134 mmol/L — ABNORMAL LOW (ref 135–145)
Total Bilirubin: 1 mg/dL (ref 0.3–1.2)
Total Protein: 8.1 g/dL (ref 6.5–8.1)

## 2018-07-15 LAB — SARS CORONAVIRUS 2 BY RT PCR (HOSPITAL ORDER, PERFORMED IN ~~LOC~~ HOSPITAL LAB): SARS Coronavirus 2: NEGATIVE

## 2018-07-15 LAB — POCT PREGNANCY, URINE: Preg Test, Ur: NEGATIVE

## 2018-07-15 LAB — LIPASE, BLOOD: Lipase: 31 U/L (ref 11–51)

## 2018-07-15 LAB — HCG, QUANTITATIVE, PREGNANCY: hCG, Beta Chain, Quant, S: <1 m[IU]/mL (ref <5)

## 2018-07-15 MED ORDER — IOHEXOL 300 MG/ML  SOLN
100.0000 mL | Freq: Once | INTRAMUSCULAR | Status: AC | PRN
  Administered 2018-07-15: 02:00:00 100 mL via INTRAVENOUS

## 2018-07-15 MED ORDER — EPINEPHRINE 1 MG/10ML IJ SOSY: PREFILLED_SYRINGE | INTRAMUSCULAR | Status: DC | PRN

## 2018-07-15 MED ORDER — OXYCODONE-ACETAMINOPHEN 5-325 MG PO TABS: 1.0000 | ORAL_TABLET | ORAL | 0 refills | Status: DC | PRN

## 2018-07-15 MED ORDER — MORPHINE SULFATE (PF) 4 MG/ML IV SOLN
4.0000 mg | Freq: Once | INTRAVENOUS | Status: AC
  Administered 2018-07-15: 05:00:00 4 mg via INTRAVENOUS
  Filled 2018-07-15: qty 1

## 2018-07-15 MED ORDER — OXYCODONE-ACETAMINOPHEN 5-325 MG PO TABS
1.0000 | ORAL_TABLET | Freq: Once | ORAL | Status: AC
  Administered 2018-07-15: 08:00:00 1 via ORAL
  Filled 2018-07-15: qty 1

## 2018-07-15 MED ORDER — TETANUS-DIPHTH-ACELL PERTUSSIS 5-2.5-18.5 LF-MCG/0.5 IM SUSP
0.5000 mL | Freq: Once | INTRAMUSCULAR | Status: AC
  Administered 2018-07-15: 07:00:00 0.5 mL via INTRAMUSCULAR
  Filled 2018-07-15: qty 0.5

## 2018-07-15 MED ORDER — SODIUM CHLORIDE 0.9 % IV BOLUS
1000.0000 mL | Freq: Once | INTRAVENOUS | Status: AC
  Administered 2018-07-15: 1000 mL via INTRAVENOUS

## 2018-07-15 MED ORDER — CEFAZOLIN SODIUM-DEXTROSE 1-4 GM/50ML-% IV SOLN
1.0000 g | Freq: Once | INTRAVENOUS | Status: AC
  Administered 2018-07-15: 06:00:00 1 g via INTRAVENOUS
  Filled 2018-07-15: qty 50

## 2018-07-15 MED ORDER — CEFAZOLIN SODIUM 1 G IJ SOLR
1.0000 g | Freq: Once | INTRAMUSCULAR | Status: AC
  Administered 2018-07-15: 08:00:00 1 g via INTRAMUSCULAR
  Filled 2018-07-15: qty 10

## 2018-07-15 MED ORDER — ONDANSETRON HCL 4 MG/2ML IJ SOLN
4.0000 mg | Freq: Once | INTRAMUSCULAR | Status: AC
  Administered 2018-07-15: 4 mg via INTRAVENOUS
  Filled 2018-07-15: qty 2

## 2018-07-15 MED ORDER — CEPHALEXIN 500 MG PO CAPS: 500.0000 mg | ORAL_CAPSULE | Freq: Three times a day (TID) | ORAL | 0 refills | Status: DC

## 2018-07-15 MED ORDER — FENTANYL CITRATE (PF) 100 MCG/2ML IJ SOLN
50.0000 ug | Freq: Once | INTRAMUSCULAR | Status: AC
  Administered 2018-07-15: 50 ug via INTRAVENOUS
  Filled 2018-07-15: qty 2

## 2018-07-15 MED ORDER — FENTANYL CITRATE (PF) 100 MCG/2ML IJ SOLN
50.0000 ug | Freq: Once | INTRAMUSCULAR | Status: AC
  Administered 2018-07-15: 01:00:00 50 ug via INTRAVENOUS

## 2018-07-15 NOTE — ED Notes (Signed)
Xray at the bedside.

## 2018-07-15 NOTE — ED Notes (Signed)
Pt discharge instructions were reviewed with patient using [employer]. Interpreter ID number [id number]. Instructions were reviewed with pt and pt questions were answered. Pt verbalized understanding and denied any other questions at this time. Pt was provided with hospital scrubs and sandals to wear. Arm sling was applied to patients right arm.

## 2018-07-15 NOTE — ED Notes (Signed)
Pt IV no longer infusing. Unable to flush with NS. No swelling or redness at site. MD aware. IV antibiotics stopped.
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
## 2018-07-15 NOTE — ED Notes (Addendum)
[employer] at the bedside to interview pt
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
## 2018-07-15 NOTE — ED Notes (Signed)
Pt asking for food, states that she has not ate in 2 days. Pt given water and meal tray.

## 2018-07-15 NOTE — Discharge Instructions (Signed)
1.  Take antibiotic as prescribed (Keflex 500 mg 3 times daily x7 days). 2.  You may take ibuprofen as needed for pain.  Take Percocet as needed for more severe pain. 3.  You may wear sling as needed for comfort. 4.  Keep wounds clean and dry. 5.  Return to the ER for worsening symptoms, persistent vomiting, lethargy, increased redness/swelling, purulent discharge or other concerns.

## 2018-07-15 NOTE — ED Notes (Signed)
MD at the bedside for laceration repair; suture cart at the bedside.
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
## 2018-07-26 ENCOUNTER — Encounter: Payer: Self-pay | Admitting: Emergency Medicine

## 2018-07-26 ENCOUNTER — Other Ambulatory Visit: Payer: Self-pay

## 2018-07-26 ENCOUNTER — Emergency Department
Admission: EM | Admit: 2018-07-26 | Discharge: 2018-07-26 | Disposition: A | Discharge status: Home / Self Care | Payer: Medicaid Other | Attending: Emergency Medicine | Admitting: Emergency Medicine

## 2018-07-26 DIAGNOSIS — Z4802 Encounter for removal of sutures: Secondary | ICD-10-CM | POA: Diagnosis not present

## 2018-07-26 NOTE — ED Triage Notes (Signed)
Pt here to have stitches removed 

## 2018-07-26 NOTE — ED Notes (Signed)
See triage note  Here for suture removal to right arm

## 2018-07-26 NOTE — ED Provider Notes (Signed)
[facility] Emergency Department Provider Note  ________________  Time seen: 2:37 PM  I have reviewed the triage vital signs and the nursing notes.   HISTORY  Chief Complaint Suture / Staple Removal   HPI  Stephanie Fischer is a 38 y.o. female who presents to the emergency department for suture removal.  No concerns.   Past Medical History:  Diagnosis Date  . Gestational diabetes   . Gestational diabetes   . Headache     Patient Active Problem List   Diagnosis Date Noted  . Lipoma of back 12/30/2016    Past Surgical History:  Procedure Laterality Date  . APPENDECTOMY    . CESAREAN SECTION    . LIPOMA EXCISION Bilateral 01/14/2017   Procedure: EXCISION LIPOMA;  Surgeon: Robert Bellow, MD;  Location: [facility] ORS;  Service: General;  Laterality: Bilateral;    Current Outpatient Rx  . Order #: [id number] Class: Print  . Order #: [id number] Class: Print    Allergies Patient has no known allergies.  No family history on file.  Social History Social History   Tobacco Use  . Smoking status: Never Smoker  . Smokeless tobacco: Never Used  Substance Use Topics  . Alcohol use: No  . Drug use: No    Review of Systems  Constitutional: Denies fever.  HEENT: No change from baseline Respiratory: No cough or shortness of breath Musculoskeletal: No pain. Skin: healing wound; pain gradually resolving.  ____________________________________________   PHYSICAL EXAM:  VITAL SIGNS: ED Triage Vitals  Enc Vitals Group     BP 07/26/18 1404 117/81     Pulse Rate 07/26/18 1404 89     Resp 07/26/18 1404 20     Temp 07/26/18 1404 98.6 F (37 C)     Temp Source 07/26/18 1404 Oral     SpO2 07/26/18 1404 100 %     Weight 07/26/18 1405 150 lb (68 kg)     Height 07/26/18 1405 5\' 4"  (1.626 m)     Head Circumference --      Peak Flow --      Pain Score 07/26/18 1404 0     Pain Loc --      Pain Edu? --      Excl. in [place]? --      Constitutional: Appears well. No distress HEENT: Atraumtaic, normal appearance, sclera normal, voice normal. Respiratory: Respirations even and unlabored.  Cardiovascular: Capillary refill normal. Peripheral pulses 2+ Musculoskeletal: Full ROM x 4. Skin: Well approximated. No evidence of infection or cellulitis. Neurovascular: Gait steady; Alert and oriented x 4.   PROCEDURES  Procedure(s) performed: SUTURE REMOVAL Performed by: RN  Location details: Right upper extremity  Wound Appearance: clean  Facility: sutures placed in this facility  Patient tolerance: Patient tolerated the procedure well with no immediate complications. ____________________________________________   INITIAL IMPRESSION / ASSESSMENT AND PLAN / ED COURSE  Pertinent labs & imaging results that were available during my care of the patient were reviewed by me and considered in my medical decision making (see chart for details).   Wound care discussed. Patient advised to keep covered with sunscreen. Patient was advised to return to the ER for symptoms that change or worsen if unable to schedule an appointment with primary care.  ____________________________________________   FINAL CLINICAL IMPRESSION(S) / ED DIAGNOSES  Final diagnoses:  Encounter for removal of sutures      Victorino Dike, FNP 07/27/18 1602    Carrie Mew, MD 07/31/18 0000

## 2018-07-26 NOTE — Discharge Instructions (Signed)
Please follow-up with your primary care provider for symptoms of concern.  If you have concerns and you are unable to schedule an appointment please return to the emergency department.

## 2018-08-31 DIAGNOSIS — S61011A Laceration without foreign body of right thumb without damage to nail, initial encounter: Secondary | ICD-10-CM | POA: Diagnosis not present

## 2018-09-08 DIAGNOSIS — S66891D Other injury of other specified muscles, fascia and tendons at wrist and hand level, right hand, subsequent encounter: Secondary | ICD-10-CM | POA: Diagnosis not present

## 2018-10-14 DIAGNOSIS — Z1159 Encounter for screening for other viral diseases: Secondary | ICD-10-CM | POA: Diagnosis not present

## 2018-10-17 DIAGNOSIS — H902 Conductive hearing loss, unspecified: Secondary | ICD-10-CM | POA: Diagnosis not present

## 2018-10-17 DIAGNOSIS — H9072 Mixed conductive and sensorineural hearing loss, unilateral, left ear, with unrestricted hearing on the contralateral side: Secondary | ICD-10-CM | POA: Diagnosis not present

## 2018-12-07 DIAGNOSIS — Z23 Encounter for immunization: Secondary | ICD-10-CM | POA: Diagnosis not present

## 2018-12-14 ENCOUNTER — Telehealth: Payer: Self-pay

## 2018-12-14 ENCOUNTER — Ambulatory Visit (INDEPENDENT_AMBULATORY_CARE_PROVIDER_SITE_OTHER): Payer: Medicaid Other | Admitting: Certified Nurse Midwife

## 2018-12-14 ENCOUNTER — Other Ambulatory Visit: Payer: Self-pay | Admitting: Physician Assistant

## 2018-12-14 ENCOUNTER — Other Ambulatory Visit: Payer: Self-pay

## 2018-12-14 ENCOUNTER — Other Ambulatory Visit: Payer: No Typology Code available for payment source

## 2018-12-14 ENCOUNTER — Encounter: Payer: Self-pay | Admitting: Certified Nurse Midwife

## 2018-12-14 VITALS — BP 133/92 | HR 93 | Ht 64.0 in | Wt 157.4 lb

## 2018-12-14 DIAGNOSIS — N912 Amenorrhea, unspecified: Secondary | ICD-10-CM | POA: Diagnosis not present

## 2018-12-14 DIAGNOSIS — Z30431 Encounter for routine checking of intrauterine contraceptive device: Secondary | ICD-10-CM | POA: Diagnosis not present

## 2018-12-14 LAB — POCT URINE PREGNANCY: Preg Test, Ur: NEGATIVE

## 2018-12-14 NOTE — Addendum Note (Signed)
Addended by: Raliegh Ip on: 12/14/2018 05:17 PM   Modules accepted: Orders

## 2018-12-14 NOTE — Patient Instructions (Signed)

## 2018-12-14 NOTE — Telephone Encounter (Signed)
[facility]- patient had an appointment today with AT. Patient was concerned about her PCP not being able to see the strings of her Mirena IUD and the patient was concerned about possible pregnancy. An ultrasound was obtained. First report was no pregnancy and IUD present. However when the report reached AT for review it was reported there was a" small something but it wasn't the IUD". AT had already seen the patient and assured her the IUD was present. A Beta was drawn per patient request. This writer left a voicemail for patient to return call ASAP. Brief explanation given on message the possibility of no IUD being present.

## 2018-12-14 NOTE — Telephone Encounter (Signed)
Patient walked into clinic asking about the voicemail that was left for her. She was taken into a room and AT explained the result of the ultrasound was inconclusive and a physician would need to review. A urine for pregnancy was obtained and was negative. It was again stressed very strongly to patient not to have unprotected intercourse. Patient expressed understanding as she does not desire pregnancy at this time per patient.

## 2018-12-14 NOTE — Progress Notes (Addendum)
Subjective:    Maleea Filipowicz is a 38 y.o. female who presents for evaluation IUD. Pt state IUD placed 5 yrs ago.  Pt state she went to her PCP and they were unable to see string for IUD.  Marland Kitchen Current contraceptive method: IUD. Sje is concerned that she may be pregnant. Pregnancy is not desired. She has had negative UPT at home.   No LMP recorded. (Menstrual status: IUD). The following portions of the patient's history were reviewed and updated as appropriate: allergies, current medications, past family history, past medical history, past social history, past surgical history and problem list. Review of Systems Pertinent items are noted in HPI.    Objective:     Pelvic: cervix normal in appearance, external genitalia normal, vagina normal without discharge and no strings visualized    UPT: negative   Patient Name: Raley Kaller DOB: [birth year] MRN: [id number]  ULTRASOUND REPORT  Location: [facility]  Date of Service: 12/14/2018   Indications:Pelvic Pain Findings:  The uterus is anteverted and measures 9.5 x 4.9 x 6.7 cm. Echo texture is homogenous without evidence of focal masses.  The Endometrium measures 15 mm.  Right Ovary measures 3.2 x 1.7 x 2.3 cm. It is normal in appearance.Simple 2.1 cm cyst Left Ovary measures 4.7 x 2.4 x 2.0 cm. It is normal in appearance. Survey of the adnexa demonstrates no adnexal masses. There is small amount of free fluid in the cul de sac.  Impression: 1. No IUD seen at this time.  Recommendations: 1.Clinical correlation with the patient's History and Physical Exam.   Jenine M. Albertine Grates    RDMS    Assessment:    Amenorrhea     Plan:    Discussed the evaluation and treatment of amenorrhea with the patient. Beta HCG ordered   Will follow up with results. MD to review u/s . Will order x-ray for IUD.   Philip Aspen, CNM

## 2018-12-14 NOTE — Telephone Encounter (Signed)
Another message was left for the patient in which this writer stressed the possibility of no IUD being present. Also strongly suggested x3  no unprotected intercourse for the next few days until the ultrasound can be reviewed by a physician. Patient was asked again to return our call ASAP to discuss this.

## 2018-12-15 ENCOUNTER — Telehealth: Payer: Self-pay | Admitting: Certified Nurse Midwife

## 2018-12-15 ENCOUNTER — Other Ambulatory Visit: Payer: Self-pay | Admitting: Certified Nurse Midwife

## 2018-12-15 DIAGNOSIS — T8332XA Displacement of intrauterine contraceptive device, initial encounter: Secondary | ICD-10-CM

## 2018-12-15 LAB — BETA HCG QUANT (REF LAB): hCG Quant: <1 m[IU]/mL

## 2018-12-15 NOTE — Telephone Encounter (Signed)
The patient called and stated that she had a blood test done and is wanting to do another test. Please advise.

## 2018-12-16 ENCOUNTER — Telehealth: Payer: Self-pay

## 2018-12-16 NOTE — Telephone Encounter (Signed)
Spoke with patient- she can go to [facility] anytime before her appointment on Monday 12/19/18 to have an abdominal x-ray done to check placement of IUD. Strongly advised she go to [facility] as soon as possible before her appointment on 12/19/18. Patient expressed understanding.

## 2018-12-16 NOTE — Telephone Encounter (Signed)
Patient had a BETA done 12/14/18 result was less than 1%. Per AT no additional blood draw for BETA is necessary. Patient is to have an xray for IUD placement. Spoke with patient 12/16/18 to inform her she needed to go to [facility] as soon as possible for that xray- it needs to be done before her appointment on Monday 12/16/18 for removal and reinsertion of IUD with AT. Patient expressed understanding.

## 2018-12-19 ENCOUNTER — Encounter: Payer: No Typology Code available for payment source | Admitting: Certified Nurse Midwife

## 2018-12-19 ENCOUNTER — Ambulatory Visit
Admission: RE | Admit: 2018-12-19 | Discharge: 2018-12-19 | Disposition: A | Discharge status: Home / Self Care | Payer: Medicaid Other | Attending: Certified Nurse Midwife | Admitting: Certified Nurse Midwife

## 2018-12-19 ENCOUNTER — Ambulatory Visit
Admission: RE | Admit: 2018-12-19 | Discharge: 2018-12-19 | Disposition: A | Discharge status: Home / Self Care | Payer: Medicaid Other | Source: Ambulatory Visit | Attending: Certified Nurse Midwife | Admitting: Certified Nurse Midwife

## 2018-12-19 ENCOUNTER — Telehealth: Payer: Self-pay

## 2018-12-19 ENCOUNTER — Other Ambulatory Visit: Payer: Self-pay | Admitting: Certified Nurse Midwife

## 2018-12-19 DIAGNOSIS — T8332XA Displacement of intrauterine contraceptive device, initial encounter: Secondary | ICD-10-CM

## 2018-12-19 MED ORDER — OXYCODONE-ACETAMINOPHEN 5-325 MG PO TABS: 1.0000 | ORAL_TABLET | ORAL | 0 refills | Status: DC | PRN

## 2018-12-19 NOTE — Telephone Encounter (Signed)
A telephone call was placed to patient to see when and if she got the suggested xray done for IUD placement. Patient states she did not go because she didn't know where to go. All instructions were given to patient on 12/16/18 x 2 and she repeated information back to me. Also [place] reviewed process with patient when she called. Patient will call us after she has the xray done.

## 2018-12-19 NOTE — Progress Notes (Signed)
Orders placed for percocet for pre procedure placement of IUD and for after pain.   Philip Aspen, CNM

## 2019-01-23 ENCOUNTER — Encounter: Payer: No Typology Code available for payment source | Admitting: Certified Nurse Midwife

## 2019-02-02 DIAGNOSIS — S66891D Other injury of other specified muscles, fascia and tendons at wrist and hand level, right hand, subsequent encounter: Secondary | ICD-10-CM | POA: Diagnosis not present

## 2019-02-08 ENCOUNTER — Ambulatory Visit: Payer: No Typology Code available for payment source | Admitting: Certified Nurse Midwife

## 2019-04-07 ENCOUNTER — Emergency Department
Admission: EM | Admit: 2019-04-07 | Discharge: 2019-04-08 | Disposition: A | Discharge status: Home / Self Care | Payer: Commercial Managed Care - PPO | Attending: Emergency Medicine | Admitting: Emergency Medicine

## 2019-04-07 ENCOUNTER — Emergency Department: Payer: Commercial Managed Care - PPO

## 2019-04-07 ENCOUNTER — Other Ambulatory Visit: Payer: Self-pay

## 2019-04-07 DIAGNOSIS — N939 Abnormal uterine and vaginal bleeding, unspecified: Secondary | ICD-10-CM | POA: Diagnosis not present

## 2019-04-07 DIAGNOSIS — D259 Leiomyoma of uterus, unspecified: Secondary | ICD-10-CM | POA: Diagnosis not present

## 2019-04-07 DIAGNOSIS — R102 Pelvic and perineal pain: Secondary | ICD-10-CM | POA: Diagnosis not present

## 2019-04-07 DIAGNOSIS — R109 Unspecified abdominal pain: Secondary | ICD-10-CM | POA: Diagnosis not present

## 2019-04-07 DIAGNOSIS — R9431 Abnormal electrocardiogram [ECG] [EKG]: Secondary | ICD-10-CM | POA: Diagnosis not present

## 2019-04-07 LAB — BASIC METABOLIC PANEL
Anion gap: 10 (ref 5–15)
BUN: 16 mg/dL (ref 6–20)
CO2: 23 mmol/L (ref 22–32)
Calcium: 8.8 mg/dL — ABNORMAL LOW (ref 8.9–10.3)
Chloride: 105 mmol/L (ref 98–111)
Creatinine, Ser: 0.52 mg/dL (ref 0.44–1.00)
GFR calc Af Amer: >60 mL/min (ref >60)
GFR calc non Af Amer: >60 mL/min (ref >60)
Glucose, Bld: 92 mg/dL (ref 70–99)
Potassium: 3.7 mmol/L (ref 3.5–5.1)
Sodium: 138 mmol/L (ref 135–145)

## 2019-04-07 LAB — CBC
HCT: 35.1 % — ABNORMAL LOW (ref 36.0–46.0)
Hemoglobin: 11.2 g/dL — ABNORMAL LOW (ref 12.0–15.0)
MCH: 24.1 pg — ABNORMAL LOW (ref 26.0–34.0)
MCHC: 31.9 g/dL (ref 30.0–36.0)
MCV: 75.6 fL — ABNORMAL LOW (ref 80.0–100.0)
Platelets: 348 10*3/uL (ref 150–400)
RBC: 4.64 MIL/uL (ref 3.87–5.11)
RDW: 17.8 % — ABNORMAL HIGH (ref 11.5–15.5)
WBC: 8.1 10*3/uL (ref 4.0–10.5)
nRBC: 0 % (ref 0.0–0.2)

## 2019-04-07 LAB — TYPE AND SCREEN
ABO/RH(D): O POS
Antibody Screen: NEGATIVE

## 2019-04-07 LAB — URINALYSIS, COMPLETE (UACMP) WITH MICROSCOPIC
Bacteria, UA: NONE SEEN
Bilirubin Urine: NEGATIVE
Glucose, UA: NEGATIVE mg/dL
Ketones, ur: 20 mg/dL — AB
Leukocytes,Ua: NEGATIVE
Nitrite: NEGATIVE
Protein, ur: 100 mg/dL — AB
Specific Gravity, Urine: 1.03 (ref 1.005–1.030)
pH: 5 (ref 5.0–8.0)

## 2019-04-07 LAB — WET PREP, GENITAL
Clue Cells Wet Prep HPF POC: NONE SEEN
Sperm: NONE SEEN
Trich, Wet Prep: NONE SEEN
Yeast Wet Prep HPF POC: NONE SEEN

## 2019-04-07 LAB — POCT PREGNANCY, URINE: Preg Test, Ur: NEGATIVE

## 2019-04-07 MED ORDER — LACTATED RINGERS IV BOLUS
1000.0000 mL | Freq: Once | INTRAVENOUS | Status: AC
  Administered 2019-04-07: 21:00:00 1000 mL via INTRAVENOUS

## 2019-04-07 MED ORDER — OXYCODONE-ACETAMINOPHEN 5-325 MG PO TABS
1.0000 | ORAL_TABLET | Freq: Once | ORAL | Status: AC
  Administered 2019-04-07: 19:00:00 1 via ORAL
  Filled 2019-04-07: qty 1

## 2019-04-07 MED ORDER — ONDANSETRON 4 MG PO TBDP: 4.0000 mg | ORAL_TABLET | Freq: Three times a day (TID) | ORAL | 0 refills | Status: DC | PRN

## 2019-04-07 MED ORDER — MORPHINE SULFATE (PF) 4 MG/ML IV SOLN
4.0000 mg | Freq: Once | INTRAVENOUS | Status: AC
  Administered 2019-04-07: 21:00:00 4 mg via INTRAVENOUS
  Filled 2019-04-07: qty 1

## 2019-04-07 MED ORDER — OXYCODONE-ACETAMINOPHEN 5-325 MG PO TABS: 1.0000 | ORAL_TABLET | ORAL | 0 refills | Status: AC | PRN

## 2019-04-07 MED ORDER — SODIUM CHLORIDE 0.9% FLUSH
3.0000 mL | Freq: Once | INTRAVENOUS | Status: AC
  Administered 2019-04-07: 21:00:00 3 mL via INTRAVENOUS

## 2019-04-07 MED ORDER — ONDANSETRON HCL 4 MG/2ML IJ SOLN
4.0000 mg | Freq: Once | INTRAMUSCULAR | Status: AC
  Administered 2019-04-07: 21:00:00 4 mg via INTRAVENOUS
  Filled 2019-04-07: qty 2

## 2019-04-07 MED ORDER — IOHEXOL 300 MG/ML  SOLN
100.0000 mL | Freq: Once | INTRAMUSCULAR | Status: AC | PRN
  Administered 2019-04-07: 23:00:00 100 mL via INTRAVENOUS

## 2019-04-07 NOTE — ED Notes (Signed)
Pt transported to [facility]

## 2019-04-07 NOTE — ED Triage Notes (Addendum)
Pt comes via POV from home with c/o dizziness and heavy vaginal bleeding. Pt states this started on Jan 25 but has gotten worse.  Pt states today she has had to change her pad and tampon every hour.  Pt denies any pregnancy. Pt denies any hx of this. No recent surgeries.  Pt also states lower abdominal pain.

## 2019-04-07 NOTE — ED Triage Notes (Signed)
Brought over from [facility] with 10 days of heavy vaginal bleeding  Now feeling light headed

## 2019-04-07 NOTE — ED Provider Notes (Signed)
[facility] Emergency Department Provider Note   ____________________________________________   First MD Initiated Contact with Patient 04/07/19 2027     (approximate)  I have reviewed the triage vital signs and the nursing notes.   HISTORY  Chief Complaint Dizziness and Vaginal Bleeding    HPI Stephanie Fischer is a 39 y.o. female with no significant past medical history who presents to the ED complaining of abdominal pain and vaginal bleeding. Patient reports that she has been having significant vaginal bleeding ever since January 25. At first, she thought it was her usual. But it has persisted longer and bleeding has become more heavy. She describes going through a pad about every hour. She has also had increasing pain in her right and left pelvic area with some nausea. She describes some dysuria but denies any fevers. She has had some nausea but no vomiting. She became concerned when she started feeling dizzy earlier today and was worried her blood counts might be low.        Past Medical History:  Diagnosis Date  . Gestational diabetes   . Gestational diabetes   . Headache     Patient Active Problem List   Diagnosis Date Noted  . Lipoma of back 12/30/2016    Past Surgical History:  Procedure Laterality Date  . APPENDECTOMY    . CESAREAN SECTION    . LIPOMA EXCISION Bilateral 01/14/2017   Procedure: EXCISION LIPOMA;  Surgeon: Robert Bellow, MD;  Location: [facility] ORS;  Service: General;  Laterality: Bilateral;    Prior to Admission medications   Medication Sig Start Date End Date Taking? Authorizing Provider  ondansetron (ZOFRAN ODT) 4 MG disintegrating tablet Take 1 tablet (4 mg total) by mouth every 8 (eight) hours as needed for nausea or vomiting. 04/07/19   Blake Divine, MD  oxyCODONE-acetaminophen (PERCOCET) 5-325 MG tablet Take 1 tablet by mouth every 4 (four) hours as needed for severe pain. 04/07/19 04/06/20  Blake Divine, MD      Allergies Patient has no known allergies.  No family history on file.  Social History Social History   Tobacco Use  . Smoking status: Never Smoker  . Smokeless tobacco: Never Used  Substance Use Topics  . Alcohol use: No  . Drug use: No    Review of Systems  Constitutional: No fever/chills Eyes: No visual changes. ENT: No sore throat. Cardiovascular: Denies chest pain. Respiratory: Denies shortness of breath. Gastrointestinal: Positive for abdominal pain. Positive for nausea, no vomiting.  No diarrhea.  No constipation. Genitourinary: Positive for dysuria. Positive for vaginal bleeding. Musculoskeletal: Negative for back pain. Skin: Negative for rash. Neurological: Negative for headaches, focal weakness or numbness.  ____________________________________________   PHYSICAL EXAM:  VITAL SIGNS: ED Triage Vitals  Enc Vitals Group     BP 04/07/19 1727 121/85     Pulse Rate 04/07/19 1727 95     Resp 04/07/19 1727 18     Temp 04/07/19 1727 98.6 F (37 C)     Temp src --      SpO2 04/07/19 1727 100 %     Weight 04/07/19 1728 156 lb (70.8 kg)     Height 04/07/19 1728 5\' 2"  (1.575 m)     Head Circumference --      Peak Flow --      Pain Score 04/07/19 1727 10     Pain Loc --      Pain Edu? --      Excl. in [place]? --  Constitutional: Alert and oriented. Eyes: Conjunctivae are normal. Head: Atraumatic. Nose: No congestion/rhinnorhea. Mouth/Throat: Mucous membranes are moist. Neck: Normal ROM Cardiovascular: Normal rate, regular rhythm. Grossly normal heart sounds. Respiratory: Normal respiratory effort.  No retractions. Lungs CTAB. Gastrointestinal: Soft and tender to palpation in bilateral lower quadrants with no rebound or guarding. No distention. Genitourinary: Moderate amount of blood noted coming from the cervical os with mild cervical tenderness but no discharge noted. No adnexal tenderness. Musculoskeletal: No lower extremity tenderness nor  edema. Neurologic:  Normal speech and language. No gross focal neurologic deficits are appreciated. Skin:  Skin is warm, dry and intact. No rash noted. Psychiatric: Mood and affect are normal. Speech and behavior are normal.  ____________________________________________   LABS (all labs ordered are listed, but only abnormal results are displayed)  Labs Reviewed  WET PREP, GENITAL - Abnormal; Notable for the following components:      Result Value   WBC, Wet Prep HPF POC FEW (*)    All other components within normal limits  BASIC METABOLIC PANEL - Abnormal; Notable for the following components:   Calcium 8.8 (*)    All other components within normal limits  CBC - Abnormal; Notable for the following components:   Hemoglobin 11.2 (*)    HCT 35.1 (*)    MCV 75.6 (*)    MCH 24.1 (*)    RDW 17.8 (*)    All other components within normal limits  URINALYSIS, COMPLETE (UACMP) WITH MICROSCOPIC - Abnormal; Notable for the following components:   Color, Urine YELLOW (*)    APPearance CLEAR (*)    Hgb urine dipstick LARGE (*)    Ketones, ur 20 (*)    Protein, ur 100 (*)    All other components within normal limits  POC URINE PREG, ED  POCT PREGNANCY, URINE  CBG MONITORING, ED  TYPE AND SCREEN   ____________________________________________  EKG  ED ECG REPORT I, Blake Divine, the attending physician, personally viewed and interpreted this ECG.   Date: 04/07/2019  EKG Time: 17:29  Rate: 87  Rhythm: normal sinus rhythm  Axis: Normal  Intervals:none  ST&T Change: None   PROCEDURES  Procedure(s) performed (including Critical Care):  Procedures   ____________________________________________   INITIAL IMPRESSION / ASSESSMENT AND PLAN / ED COURSE       39 year old female presents to the ED for greater than 1 week of vaginal bleeding with worsening bilateral pelvic and abdominal pain.  Pregnancy testing negative.  Pelvic exam shows moderate amount of blood but no  significant cervical motion or adnexal tenderness.  H&H is stable, doubt any dizziness is secondary to anemia.  EKG without evidence of arrhythmia or ischemia and remainder of labs are reassuring.  Pelvic ultrasound shows uterine fibroid, likely the etiology of her pain and bleeding.  Given her right lower quadrant tenderness, CT scan was obtained, did not visualize the appendix but also did not see any inflammatory changes in her right lower quadrant.  Fibroids seems the more likely explanation for her pain at this time.  Counseled patient to follow-up with her OB/GYN and to return to the ED for new worsening symptoms, patient agrees with plan.      ____________________________________________   FINAL CLINICAL IMPRESSION(S) / ED DIAGNOSES  Final diagnoses:  Pelvic pain  Uterine leiomyoma, unspecified location  Vaginal bleeding     ED Discharge Orders         Ordered    oxyCODONE-acetaminophen (PERCOCET) 5-325 MG tablet  Every 4 hours PRN  04/07/19 2343    ondansetron (ZOFRAN ODT) 4 MG disintegrating tablet  Every 8 hours PRN     04/07/19 2343           Note:  This document was prepared using Dragon voice recognition software and may include unintentional dictation errors.   Blake Divine, MD 04/07/19 [phone]

## 2019-04-18 ENCOUNTER — Encounter: Payer: Self-pay | Admitting: Certified Nurse Midwife

## 2019-04-18 ENCOUNTER — Other Ambulatory Visit: Payer: Self-pay

## 2019-04-18 ENCOUNTER — Ambulatory Visit (INDEPENDENT_AMBULATORY_CARE_PROVIDER_SITE_OTHER): Payer: Commercial Managed Care - PPO | Admitting: Certified Nurse Midwife

## 2019-04-18 VITALS — BP 110/74 | HR 91 | Ht 62.0 in | Wt 154.0 lb

## 2019-04-18 DIAGNOSIS — L689 Hypertrichosis, unspecified: Secondary | ICD-10-CM | POA: Diagnosis not present

## 2019-04-18 DIAGNOSIS — D259 Leiomyoma of uterus, unspecified: Secondary | ICD-10-CM

## 2019-04-18 MED ORDER — IBUPROFEN 600 MG PO TABS: 600.0000 mg | ORAL_TABLET | Freq: Four times a day (QID) | ORAL | 0 refills | Status: DC | PRN

## 2019-04-18 MED ORDER — MISOPROSTOL 200 MCG PO TABS: 400.0000 ug | ORAL_TABLET | Freq: Once | ORAL | 0 refills | Status: DC

## 2019-04-18 NOTE — Patient Instructions (Signed)

## 2019-04-18 NOTE — Progress Notes (Signed)
GYN ENCOUNTER NOTE  Subjective:       Stephanie Fischer is a 39 y.o. G74P2002 female is here for gynecologic evaluation of the following issues:  1. Heavy bleeding  , she was seen in the ED on 2/5 and diagnosed with uterine fibroid and she presents today for follow up.    Gynecologic History Patient's last menstrual period was 03/27/2019. Contraception: none Last Pap: 08/17/16. Results were: normal, HPV negative  Last mammogram: n/a .   Obstetric History OB History  Gravida Para Term Preterm AB Living  2 2 2     2   SAB TAB Ectopic Multiple Live Births          2    # Outcome Date GA Lbr Len/2nd Weight Sex Delivery Anes PTL Lv  2 Term 12/29/11   5 lb (2.268 kg)  CS-Unspec  N LIV  1 Term 11/08/01   5 lb 8.2 oz (2.5 kg) M CS-Unspec  N LIV    Obstetric Comments  1st Menstrual Cycle:   13  1st Pregnancy:  14    Past Medical History:  Diagnosis Date  . Gestational diabetes   . Gestational diabetes   . Headache     Past Surgical History:  Procedure Laterality Date  . APPENDECTOMY    . CESAREAN SECTION    . LIPOMA EXCISION Bilateral 01/14/2017   Procedure: EXCISION LIPOMA;  Surgeon: Robert Bellow, MD;  Location: [facility] ORS;  Service: General;  Laterality: Bilateral;    Current Outpatient Medications on File Prior to Visit  Medication Sig Dispense Refill  . ondansetron (ZOFRAN ODT) 4 MG disintegrating tablet Take 1 tablet (4 mg total) by mouth every 8 (eight) hours as needed for nausea or vomiting. 12 tablet 0  . oxyCODONE-acetaminophen (PERCOCET) 5-325 MG tablet Take 1 tablet by mouth every 4 (four) hours as needed for severe pain. 12 tablet 0   No current facility-administered medications on file prior to visit.    No Known Allergies  Social History   Socioeconomic History  . Marital status: Legally Separated    Spouse name: Not on file  . Number of children: Not on file  . Years of education: Not on file  . Highest education level: Not on file  Occupational  History  . Not on file  Tobacco Use  . Smoking status: Never Smoker  . Smokeless tobacco: Never Used  Substance and Sexual Activity  . Alcohol use: No  . Drug use: No  . Sexual activity: Yes    Birth control/protection: I.U.D.  Other Topics Concern  . Not on file  Social History Narrative  . Not on file   Social Determinants of Health   Financial Resource Strain:   . Difficulty of Paying Living Expenses: Not on file  Food Insecurity:   . Worried About [name] in the Last Year: Not on file  . Ran Out of Food in the Last Year: Not on file  Transportation Needs:   . Lack of Transportation (Medical): Not on file  . Lack of Transportation (Non-Medical): Not on file  Physical Activity:   . Days of Exercise per Week: Not on file  . Minutes of Exercise per Session: Not on file  Stress:   . Feeling of Stress : Not on file  Social Connections:   . Frequency of Communication with Friends and Family: Not on file  . Frequency of Social Gatherings with Friends and Family: Not on file  . Attends Religious Services:  Not on file  . Active Member of Clubs or Organizations: Not on file  . Attends Archivist Meetings: Not on file  . Marital Status: Not on file  Intimate Partner Violence:   . Fear of Current or Ex-Partner: Not on file  . Emotionally Abused: Not on file  . Physically Abused: Not on file  . Sexually Abused: Not on file    No family history on file.  The following portions of the patient's history were reviewed and updated as appropriate: allergies, current medications, past family history, past medical history, past social history, past surgical history and problem list.  Review of Systems Review of Systems - Negative except as mentioned in HPI Review of Systems - General ROS: negative for - chills, fatigue, fever, hot flashes, malaise or night sweats Hematological and Lymphatic ROS: negative for - bleeding problems or swollen lymph  nodes Gastrointestinal ROS: negative for - abdominal pain, blood in stools, change in bowel habits and nausea/vomiting Musculoskeletal ROS: negative for - joint pain, muscle pain or muscular weakness Genito-Urinary ROS: negative for - change in menstrual cycle, dysmenorrhea, dyspareunia, dysuria, genital discharge, genital ulcers, hematuria, incontinence, irregular/heavy menses, nocturia or pelvic pain. Positive for heavy periods   Objective:   BP 110/74   Pulse 91   Ht 5\' 2"  (1.575 m)   Wt 154 lb (69.9 kg)   LMP 03/27/2019   BMI 28.17 kg/m  CONSTITUTIONAL: Well-developed, well-nourished female in no acute distress.  HENT:  Normocephalic, atraumatic.  NECK: Normal range of motion, supple, no masses.  Normal thyroid.  SKIN: Skin is warm and dry. No rash noted. Not diaphoretic. No erythema. No pallor. [place]: Alert and oriented to person, place, and time. PSYCHIATRIC: Normal mood and affect. Normal behavior. Normal judgment and thought content. CARDIOVASCULAR:Not Examined RESPIRATORY: Not Examined BREASTS: Not Examined ABDOMEN: Soft, non distended; Non tender.  No Organomegaly. PELVIC:not indicated  MUSCULOSKELETAL: Normal range of motion. No tenderness.  No cyanosis, clubbing, or edema.   Assessment:   Uterine Fibroid    Plan:   Discussed uterine fibroid. Discussed medication management and surgical management. Pt would like to try management with IUD. Discussed IUD risks, benefits and procedure. Also recommend use of motrin 600 mg daily x 7 days during cycle to help with bleeding . She verbalize and agrees to plan. She complains of facial hair growth and is requesting blood work, TSH and testosterone ordered. Will follow up with results . Pt to return PRN for IUD placement.   I attest more than 50% of visit spent reviewing history, reviewing previous u/s and CT scan done during ED visit. Discussing uterine fibroid , treatment options and developing a plan of care. Face to face  time 15 min. A interpretor was used to translate during this visit.  Philip Aspen, CNM

## 2019-04-19 ENCOUNTER — Telehealth: Payer: Self-pay

## 2019-04-19 LAB — THYROID PANEL WITH TSH
Free Thyroxine Index: 1.9 (ref 1.2–4.9)
T3 Uptake Ratio: 24 % (ref 24–39)
T4, Total: 7.8 ug/dL (ref 4.5–12.0)
TSH: 3.13 u[IU]/mL (ref 0.450–4.500)

## 2019-04-19 LAB — TESTOSTERONE: Testosterone: 14 ng/dL (ref 8–48)

## 2019-04-19 NOTE — Telephone Encounter (Signed)
Voicemail message left for patient - blood work results were normal per Philip Aspen CNM. Call with questions

## 2019-05-16 ENCOUNTER — Ambulatory Visit: Payer: No Typology Code available for payment source | Admitting: Certified Nurse Midwife

## 2019-05-19 ENCOUNTER — Ambulatory Visit: Payer: Medicaid Other | Attending: Internal Medicine

## 2019-05-19 DIAGNOSIS — Z23 Encounter for immunization: Secondary | ICD-10-CM

## 2019-05-19 NOTE — Progress Notes (Signed)
   Covid-19 Vaccination Clinic  Name:  Stephanie Fischer    MRN: [id number] DOB: 05-03-1980  05/19/2019  Stephanie Fischer was observed post Covid-19 immunization for 15 minutes without incident. She was provided with Vaccine Information Sheet and instruction to access the V-Safe system.   Stephanie Fischer was instructed to call 911 with any severe reactions post vaccine: [place] Kitchen Difficulty breathing  . Swelling of face and throat  . A fast heartbeat  . A bad rash all over body  . Dizziness and weakness   Immunizations Administered    Name Date Dose VIS Date Route   Pfizer COVID-19 Vaccine 05/19/2019  4:45 PM 0.3 mL 02/10/2019 Intramuscular   Manufacturer: [place]   Lot: [id number]   [place]: [id number]

## 2019-06-06 ENCOUNTER — Ambulatory Visit: Payer: No Typology Code available for payment source | Admitting: Certified Nurse Midwife

## 2019-06-09 ENCOUNTER — Ambulatory Visit: Payer: No Typology Code available for payment source

## 2019-06-10 ENCOUNTER — Ambulatory Visit: Payer: No Typology Code available for payment source | Attending: Internal Medicine

## 2019-06-10 DIAGNOSIS — Z23 Encounter for immunization: Secondary | ICD-10-CM

## 2019-06-10 NOTE — Progress Notes (Signed)
   Covid-19 Vaccination Clinic  Name:  Calani Starace    MRN: [id number] DOB: 25-Aug-1980  06/10/2019  Ms. Kamel was observed post Covid-19 immunization for 15 minutes without incident. She was provided with Vaccine Information Sheet and instruction to access the V-Safe system.   Ms. Aboud was instructed to call 911 with any severe reactions post vaccine: Marland Kitchen Difficulty breathing  . Swelling of face and throat  . A fast heartbeat  . A bad rash all over body  . Dizziness and weakness   Immunizations Administered    Name Date Dose VIS Date Route   Pfizer COVID-19 Vaccine 06/10/2019  8:52 AM 0.3 mL 02/10/2019 Intramuscular   Manufacturer: [place]   Lot: [phone] [id number]   [place]: [id number]

## 2019-06-13 ENCOUNTER — Ambulatory Visit: Payer: No Typology Code available for payment source | Admitting: Certified Nurse Midwife

## 2020-01-15 ENCOUNTER — Encounter: Payer: Self-pay | Admitting: Emergency Medicine

## 2020-01-15 ENCOUNTER — Other Ambulatory Visit: Payer: Self-pay

## 2020-01-15 ENCOUNTER — Emergency Department
Admission: EM | Admit: 2020-01-15 | Discharge: 2020-01-15 | Disposition: A | Discharge status: Home / Self Care | Payer: Commercial Managed Care - PPO | Attending: Emergency Medicine | Admitting: Emergency Medicine

## 2020-01-15 DIAGNOSIS — R6883 Chills (without fever): Secondary | ICD-10-CM | POA: Diagnosis not present

## 2020-01-15 DIAGNOSIS — R42 Dizziness and giddiness: Secondary | ICD-10-CM | POA: Insufficient documentation

## 2020-01-15 DIAGNOSIS — Z5321 Procedure and treatment not carried out due to patient leaving prior to being seen by health care provider: Secondary | ICD-10-CM | POA: Insufficient documentation

## 2020-01-15 LAB — URINALYSIS, COMPLETE (UACMP) WITH MICROSCOPIC
Bacteria, UA: NONE SEEN
Bilirubin Urine: NEGATIVE
Glucose, UA: NEGATIVE mg/dL
Hgb urine dipstick: NEGATIVE
Ketones, ur: NEGATIVE mg/dL
Leukocytes,Ua: NEGATIVE
Nitrite: NEGATIVE
Protein, ur: NEGATIVE mg/dL
Specific Gravity, Urine: 1.009 (ref 1.005–1.030)
pH: 6 (ref 5.0–8.0)

## 2020-01-15 LAB — CBC
HCT: 34.6 % — ABNORMAL LOW (ref 36.0–46.0)
Hemoglobin: 11.2 g/dL — ABNORMAL LOW (ref 12.0–15.0)
MCH: 24.7 pg — ABNORMAL LOW (ref 26.0–34.0)
MCHC: 32.4 g/dL (ref 30.0–36.0)
MCV: 76.4 fL — ABNORMAL LOW (ref 80.0–100.0)
Platelets: 298 10*3/uL (ref 150–400)
RBC: 4.53 MIL/uL (ref 3.87–5.11)
RDW: 14.2 % (ref 11.5–15.5)
WBC: 6.4 10*3/uL (ref 4.0–10.5)
nRBC: 0 % (ref 0.0–0.2)

## 2020-01-15 LAB — BASIC METABOLIC PANEL
Anion gap: 9 (ref 5–15)
BUN: 14 mg/dL (ref 6–20)
CO2: 23 mmol/L (ref 22–32)
Calcium: 8.6 mg/dL — ABNORMAL LOW (ref 8.9–10.3)
Chloride: 105 mmol/L (ref 98–111)
Creatinine, Ser: 0.55 mg/dL (ref 0.44–1.00)
GFR, Estimated: >60 mL/min (ref >60)
Glucose, Bld: 111 mg/dL — ABNORMAL HIGH (ref 70–99)
Potassium: 3.9 mmol/L (ref 3.5–5.1)
Sodium: 137 mmol/L (ref 135–145)

## 2020-01-15 NOTE — ED Triage Notes (Signed)
Pt states has had cold symptoms for last 2 days.  States yesterday used alka-seltzer cold remedy and today when she awoke her "skin was red", and she reports dizziness.  Pt also reports dilated pupils.  Pt states she is having chills.

## 2020-02-28 DIAGNOSIS — R059 Cough, unspecified: Secondary | ICD-10-CM | POA: Diagnosis not present

## 2020-02-28 DIAGNOSIS — J029 Acute pharyngitis, unspecified: Secondary | ICD-10-CM | POA: Diagnosis not present

## 2020-03-07 ENCOUNTER — Ambulatory Visit: Payer: No Typology Code available for payment source | Attending: Internal Medicine

## 2020-03-07 ENCOUNTER — Other Ambulatory Visit: Payer: Self-pay

## 2020-03-07 DIAGNOSIS — Z23 Encounter for immunization: Secondary | ICD-10-CM

## 2020-03-07 NOTE — Congregational Nurse Program (Signed)
  Dept: [phone]   Congregational Nurse Program Note  Date of Encounter: 03/07/2020  Past Medical History: Past Medical History:  Diagnosis Date  . Gestational diabetes   . Gestational diabetes   . Headache     Encounter Details:  CNP Questionnaire - 03/07/20 1744      Questionnaire   Do you give verbal consent to treat you today? Yes    Visit Setting Church or Organization    Location Patient Served At [facility]    Patient Status Not Applicable    Medical Provider Yes    Insurance Unknown    Intervention Educate    Referrals Vaccination (Covid, Flu, Pneumonia, Shingles)

## 2020-03-07 NOTE — Congregational Nurse Program (Signed)
[facility]ation note completed, patient was educated on sign and symptoms and signs of an allergic reaction.

## 2020-03-07 NOTE — Progress Notes (Signed)
° °  Covid-19 Vaccination Clinic  Name:  Stephanie Fischer    MRN: [id number] DOB: [birth year]  03/07/2020  [name] was observed post Covid-19 immunization for 15 minutes without incident. She was provided with Vaccine Information Sheet and instruction to access the V-Safe system.   [name] was instructed to call 911 with any severe reactions post vaccine:  Difficulty breathing   Swelling of face and throat   A fast heartbeat   A bad rash all over body   Dizziness and weakness   Immunizations Administered    Name Date Dose VIS Date Route   Pfizer COVID-19 Vaccine 03/07/2020  3:50 PM 0.3 mL 12/20/2019 Intramuscular   Manufacturer: [employer]   Lot: [id number]   ND[place]: [address]-2

## 2020-06-11 ENCOUNTER — Telehealth: Payer: Self-pay | Admitting: Certified Nurse Midwife

## 2020-06-11 NOTE — Telephone Encounter (Signed)
New Message:  Pt states she has a question about her birth control

## 2020-06-13 ENCOUNTER — Telehealth: Payer: Self-pay

## 2020-06-13 NOTE — Telephone Encounter (Signed)
Called patient to respond to questions she had about her birth control. No answer, left vm to call back.

## 2020-06-13 NOTE — Telephone Encounter (Signed)
Patient called in and wanted a refill on her Birth control pills. She hasn't had a visit with [place] in over a year, she did not know the name of the medication that she was on. I told her she would need an appointment to restart her birth control and we would have to do a pregnancy test to restart. She said she would call back. She wants an appointment today and I have told her that we don't have anything available.

## 2020-07-16 ENCOUNTER — Ambulatory Visit (INDEPENDENT_AMBULATORY_CARE_PROVIDER_SITE_OTHER): Payer: Commercial Managed Care - PPO | Admitting: Certified Nurse Midwife

## 2020-07-16 ENCOUNTER — Other Ambulatory Visit: Payer: Self-pay

## 2020-07-16 ENCOUNTER — Encounter: Payer: Self-pay | Admitting: Certified Nurse Midwife

## 2020-07-16 VITALS — BP 103/71 | HR 90 | Resp 16 | Ht 64.0 in | Wt 148.9 lb

## 2020-07-16 DIAGNOSIS — Z30019 Encounter for initial prescription of contraceptives, unspecified: Secondary | ICD-10-CM

## 2020-07-16 LAB — POCT URINE PREGNANCY: Preg Test, Ur: NEGATIVE

## 2020-07-16 NOTE — Progress Notes (Signed)
Subjective:    Stephanie Fischer is a 40 y.o. female who presents for contraception counseling. The patient has no complaints today. The patient is sexually active. Pertinent past medical history: none.  Menstrual History: OB History    Gravida  2   Para  2   Term  2   Preterm      AB      Living  2     SAB      IAB      Ectopic      Multiple      Live Births  2        Obstetric Comments  1st Menstrual Cycle:   13 1st Pregnancy:  14          Patient's last menstrual period was 06/27/2020 (exact date).    The following portions of the patient's history were reviewed and updated as appropriate: allergies, current medications, past family history, past medical history, past social history, past surgical history and problem list.  Review of Systems Pertinent items are noted in HPI.   Objective:    No exam performed today, patient combative and not indicated to discuss BC.   Assessment:    40 y.o., starting Nexplanon, no contraindications.   Plan:    All questions answered.  Reviewed all forms of birth control options available including abstinence; fertility period awareness methods; over the counter/barrier methods; hormonal contraceptive medication including pill, patch, ring, injection,contraceptive implant; hormonal and nonhormonal IUDs; permanent sterilization options including vasectomy and the various tubal sterilization modalities. Risks and benefits reviewed.  Questions were answered.  Pt request nexplanon and would like it placed today. See procedure not.   Philip Aspen, CNM

## 2020-07-16 NOTE — Patient Instructions (Signed)
Nexplanon Instructions After Insertion  Keep bandage clean and dry for 24 hours  May use ice/Tylenol/Ibuprofen for soreness or pain  If you develop fever, drainage or increased warmth from incision site-contact office immediately   

## 2020-07-16 NOTE — Progress Notes (Signed)
Stephanie Fischer is a 40 y.o. year old female here for Nexplanon insertion.  Patient's last menstrual period was 06/27/2020 (exact date).,, and her pregnancy test today was negative.  Risks/benefits/side effects of Nexplanon have been discussed and her questions have been answered.  Specifically, a failure rate of 03/998 has been reported, with an increased failure rate if pt takes [place] and/or antiseizure medicaitons.  Stephanie Fischer is aware of the common side effect of irregular bleeding, which the incidence of decreases over time.  BP 103/71   Pulse 90   Resp 16   Ht 5\' 4"  (1.626 m)   Wt 148 lb 14.4 oz (67.5 kg)   LMP 06/27/2020 (Exact Date)   BMI 25.56 kg/m   Results for orders placed or performed in visit on 07/16/20 (from the past 24 hour(s))  POCT urine pregnancy   Collection Time: 07/16/20  2:36 PM  Result Value Ref Range   Preg Test, Ur Negative Negative     She is right-handed, so her left arm, approximately 4 inches proximal from the elbow, was cleansed with alcohol and anesthetized with 2cc of 2% Lidocaine.  The area was cleansed again with betadine and the Nexplanon was inserted per manufacturer's recommendations without difficulty.  A steri-strip and pressure bandage were applied.  Pt was instructed to keep the area clean and dry, remove pressure bandage in 24 hours, and keep insertion site covered with the steri-strip for 3-5 days.  Back up contraception was recommended for 2 weeks.  She was given a card indicating date Nexplanon was inserted and date it needs to be removed. Follow-up PRN problems.  Philip Aspen, ,CNM

## 2020-07-26 ENCOUNTER — Telehealth: Payer: Self-pay

## 2020-07-26 NOTE — Telephone Encounter (Signed)
Pt called no answer LM via VM to contact the office to discuss the reason for her appointment with Dr. Marcelline Mates on August 13, 2020.

## 2020-08-13 ENCOUNTER — Encounter: Payer: Commercial Managed Care - PPO | Admitting: Certified Nurse Midwife

## 2020-08-13 ENCOUNTER — Encounter: Payer: Commercial Managed Care - PPO | Admitting: Obstetrics and Gynecology

## 2020-08-21 IMAGING — CR DG ABDOMEN 2V
2 series · 2 of 2 positions shown · non-contrast
Comparison: None.

CLINICAL DATA: IUD migration

EXAM:
ABDOMEN - 2 VIEW

[abdomen erect]
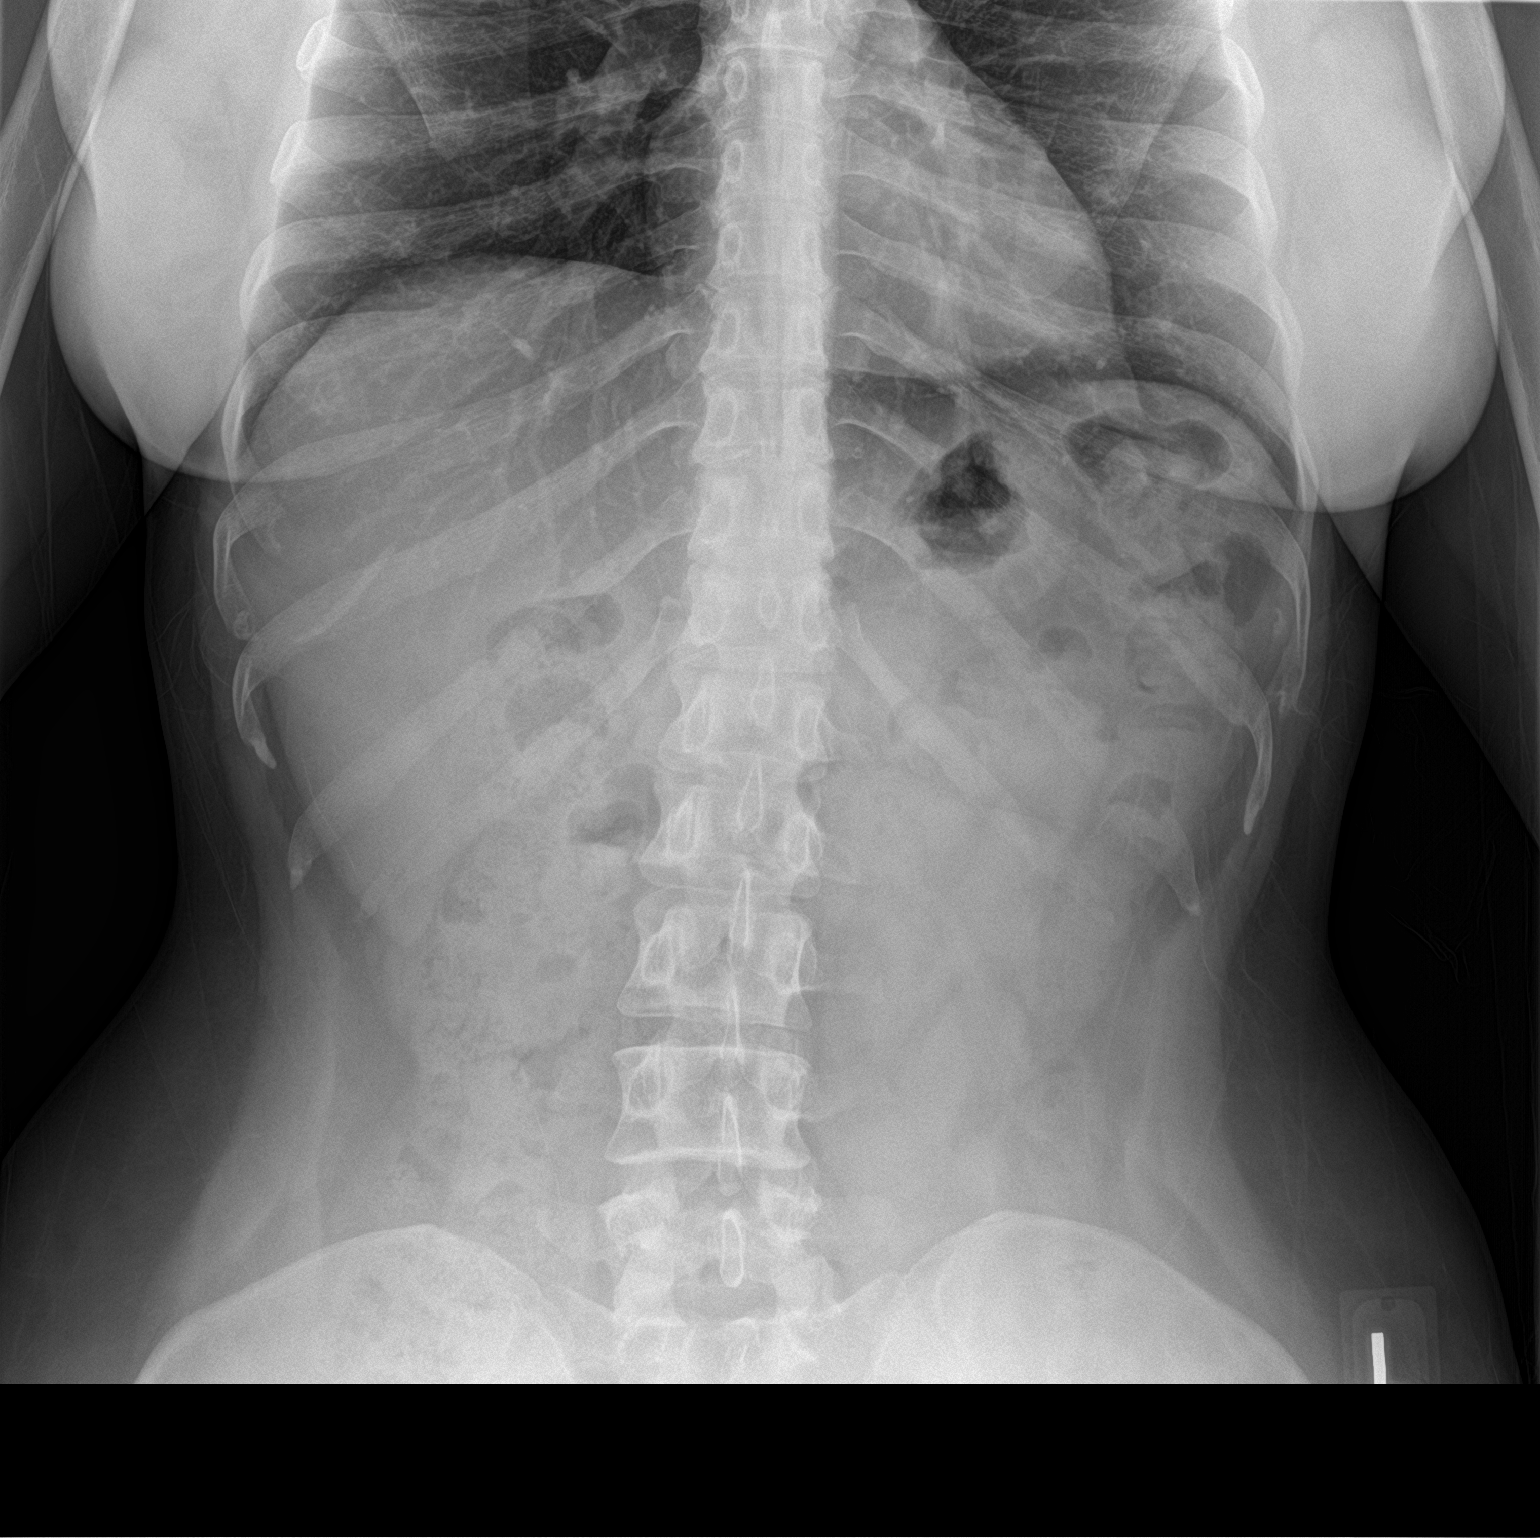

[abdomen supine]
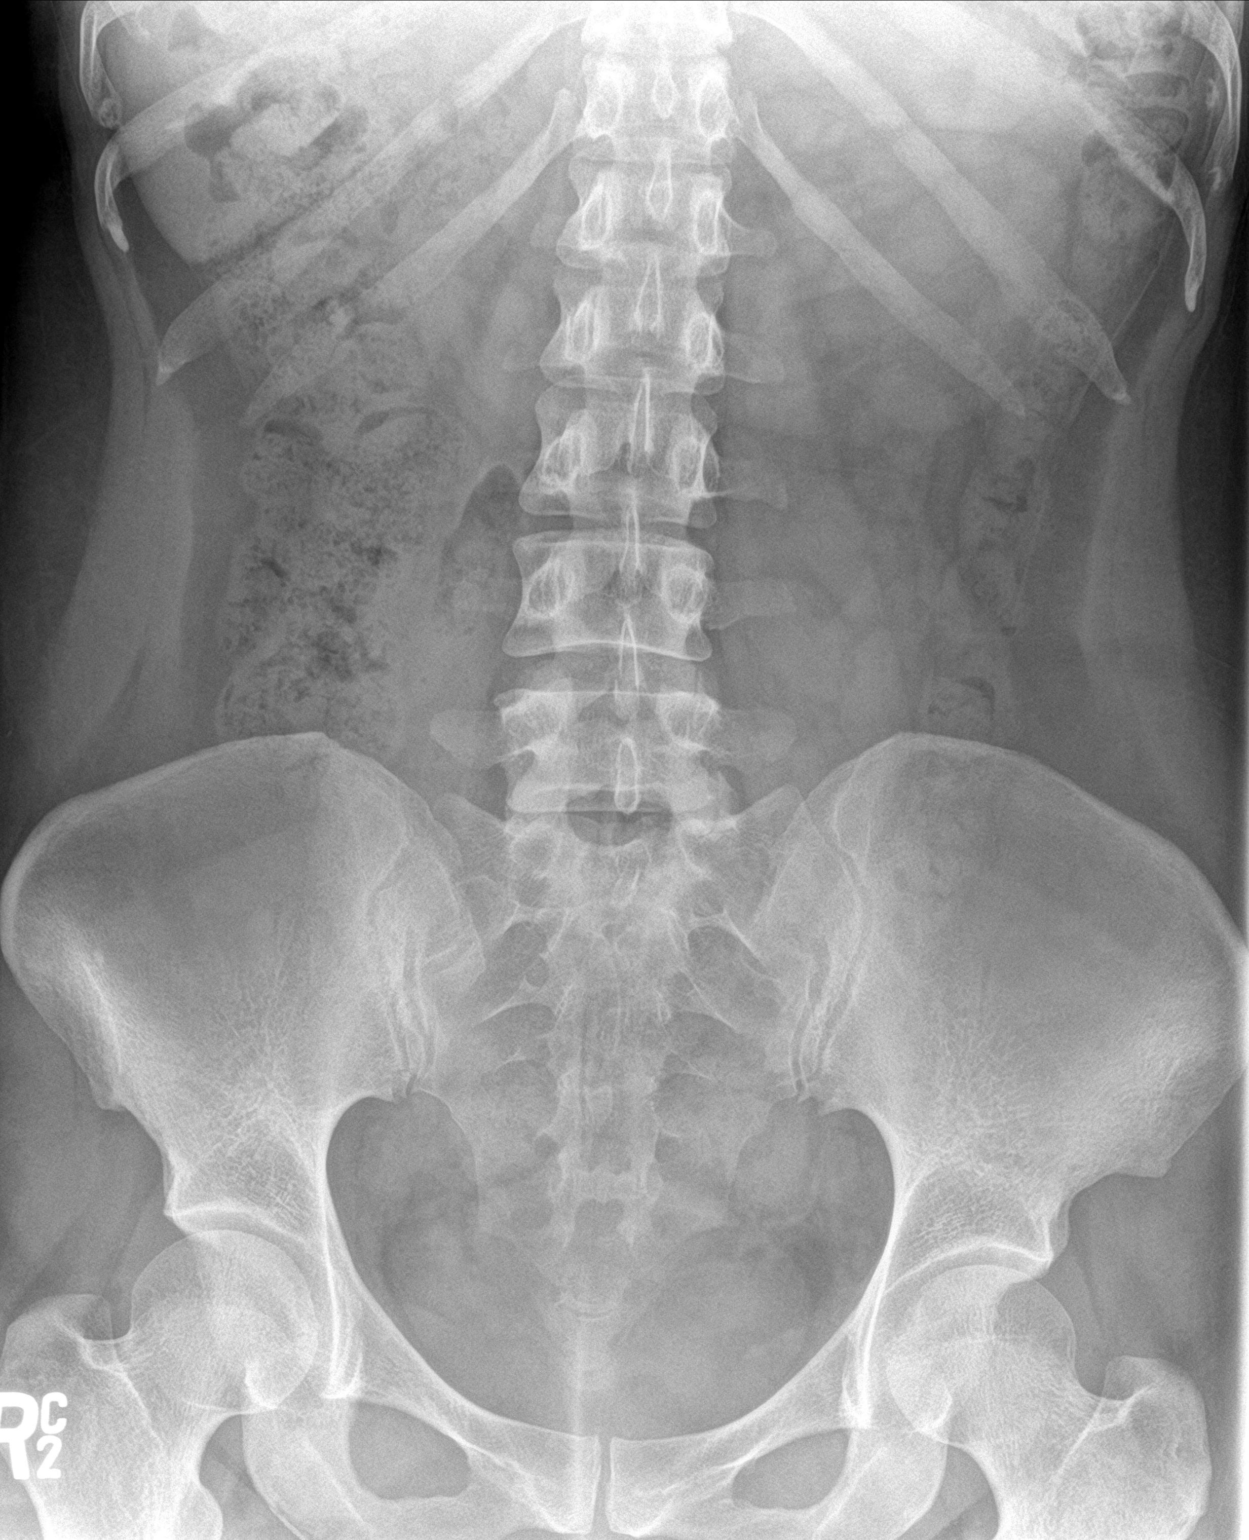

[2 of 2 positions shown; findings below may reference images not displayed]

FINDINGS: No evidence of an IUD in the abdomen or pelvis to the level of the
pubic symphysis. Nonobstructive bowel gas pattern. No evidence of
free air. Lung bases are clear. No acute finding in the visualized
skeleton.
IMPRESSION: No evidence of an IUD in the abdomen or pelvis to the level of the
pubic symphysis.

## 2020-08-30 ENCOUNTER — Encounter: Payer: Self-pay | Admitting: Certified Nurse Midwife

## 2020-09-11 DIAGNOSIS — N898 Other specified noninflammatory disorders of vagina: Secondary | ICD-10-CM | POA: Diagnosis not present

## 2020-09-11 DIAGNOSIS — R350 Frequency of micturition: Secondary | ICD-10-CM | POA: Diagnosis not present

## 2020-09-26 DIAGNOSIS — R300 Dysuria: Secondary | ICD-10-CM | POA: Diagnosis not present

## 2020-09-26 DIAGNOSIS — N390 Urinary tract infection, site not specified: Secondary | ICD-10-CM | POA: Diagnosis not present

## 2020-10-30 DIAGNOSIS — N390 Urinary tract infection, site not specified: Secondary | ICD-10-CM | POA: Diagnosis not present

## 2020-10-30 DIAGNOSIS — R300 Dysuria: Secondary | ICD-10-CM | POA: Diagnosis not present

## 2020-12-08 IMAGING — US US PELVIS COMPLETE TRANSABD/TRANSVAG W DUPLEX
1 series · 13 of 25 positions shown · non-contrast
Comparison: 12/14/2018

CLINICAL DATA: Pelvic pain and vaginal bleeding.

EXAM:
TRANSABDOMINAL AND TRANSVAGINAL ULTRASOUND OF PELVIS
DOPPLER ULTRASOUND OF OVARIES
TECHNIQUE: Both transabdominal and transvaginal ultrasound examinations of the
pelvis were performed. Transabdominal technique was performed for
global imaging of the pelvis including uterus, ovaries, adnexal
regions, and pelvic cul-de-sac.
It was necessary to proceed with endovaginal exam following the
transabdominal exam to visualize the uterus more accurately. Color
and duplex Doppler ultrasound was utilized to evaluate blood flow to
the ovaries.

[Series 1: us pelvis complete transabd/transvag w duplex · 13 of 127 slices shown]
[im 1/127]
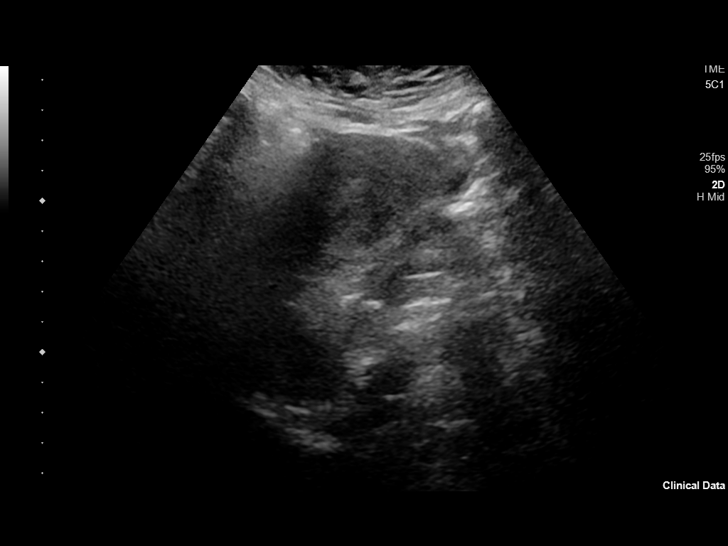
[im 11/127]
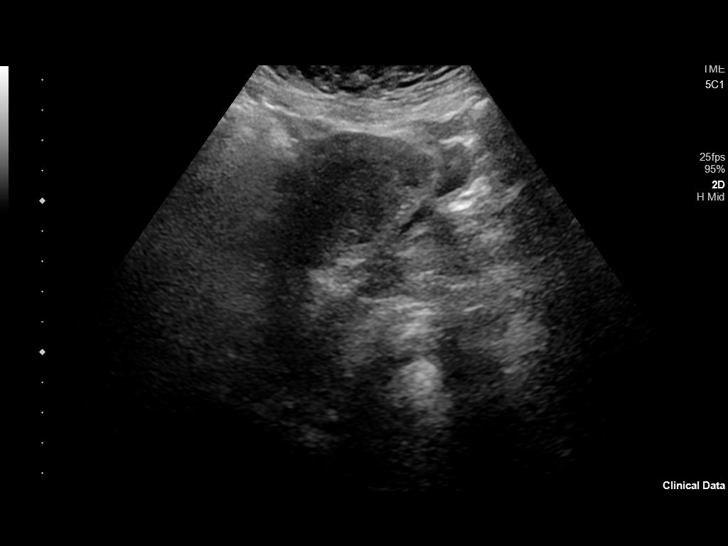
[im 22/127]
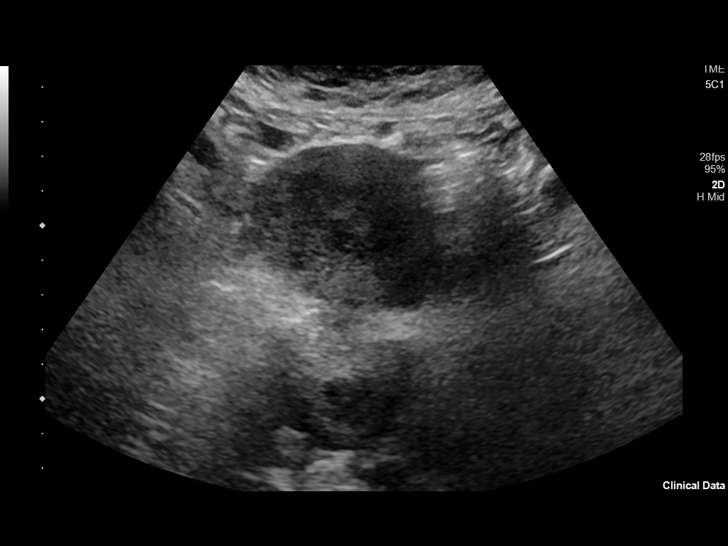
[im 32/127]
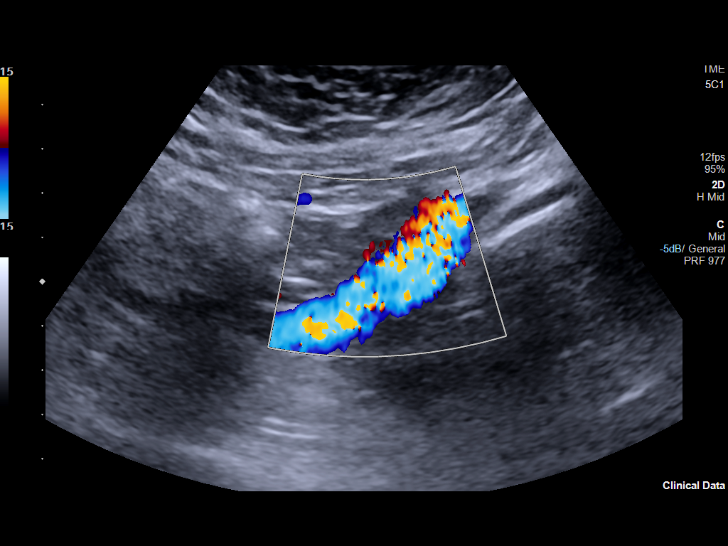
[im 43/127]
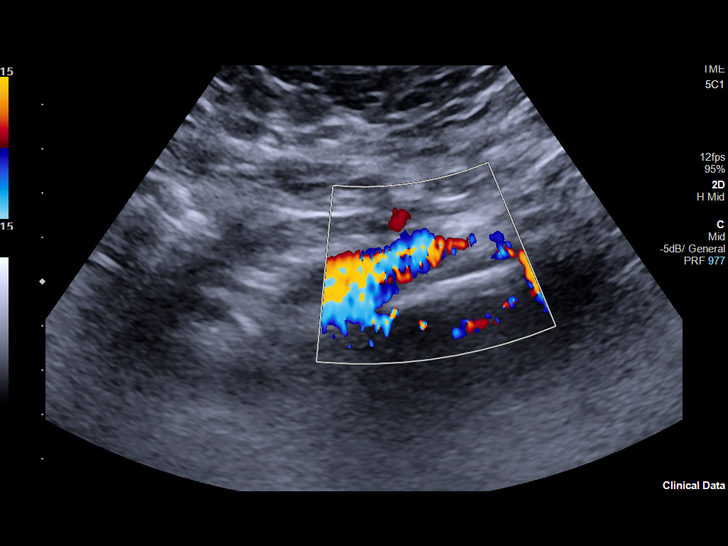
[im 53/127]
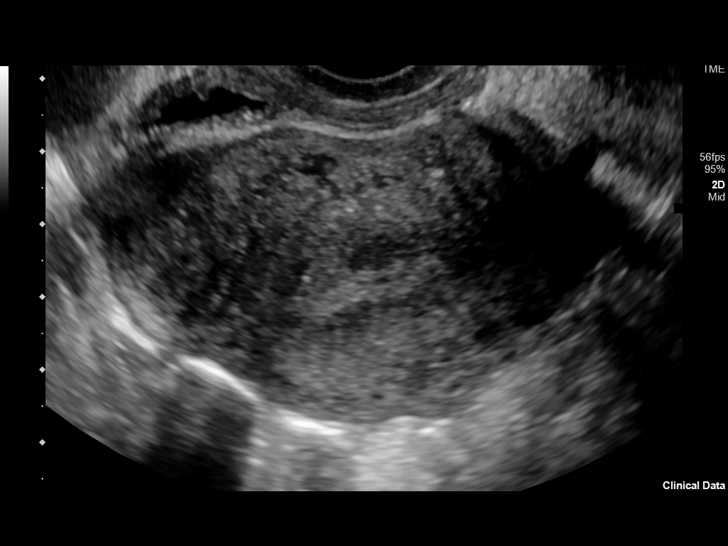
[im 64/127]
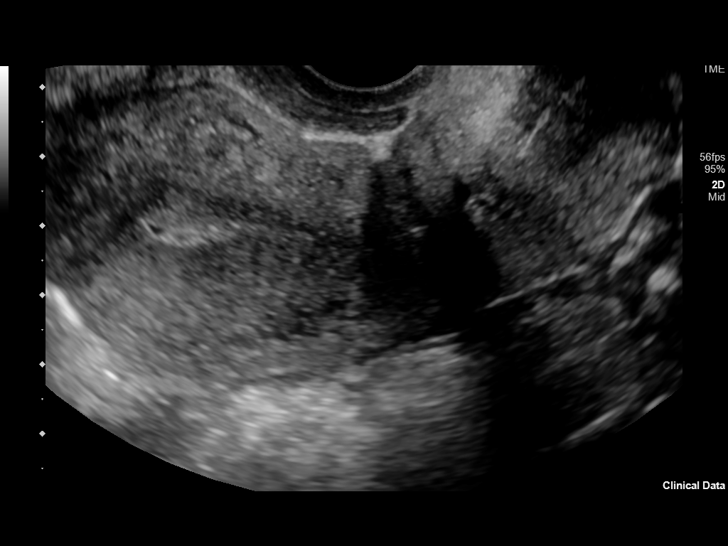
[im 74/127]
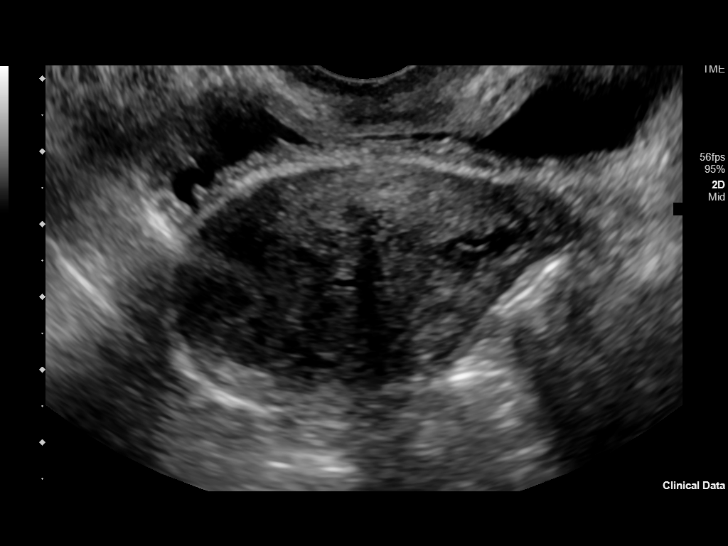
[im 85/127]
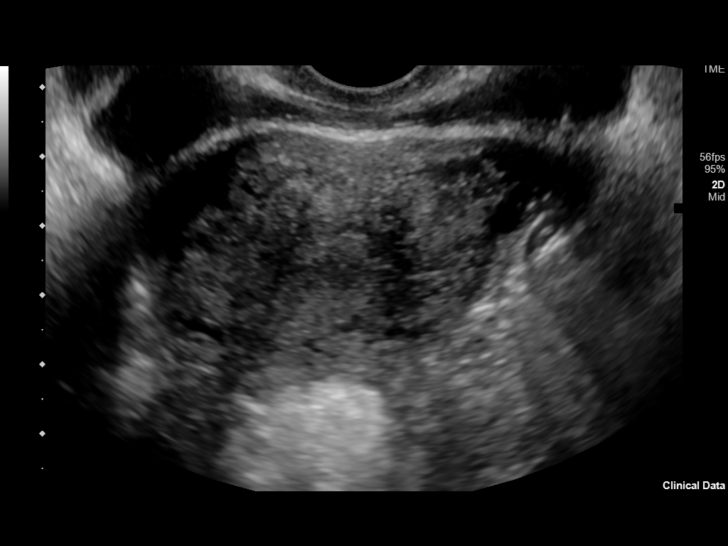
[im 95/127]
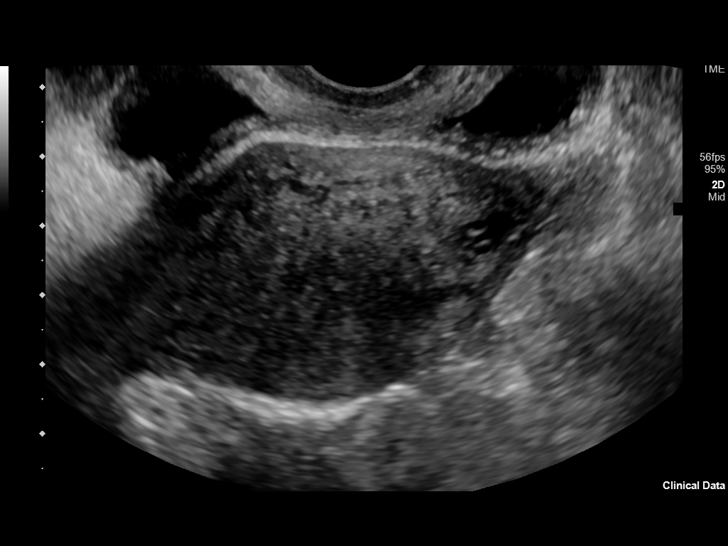
[im 106/127]
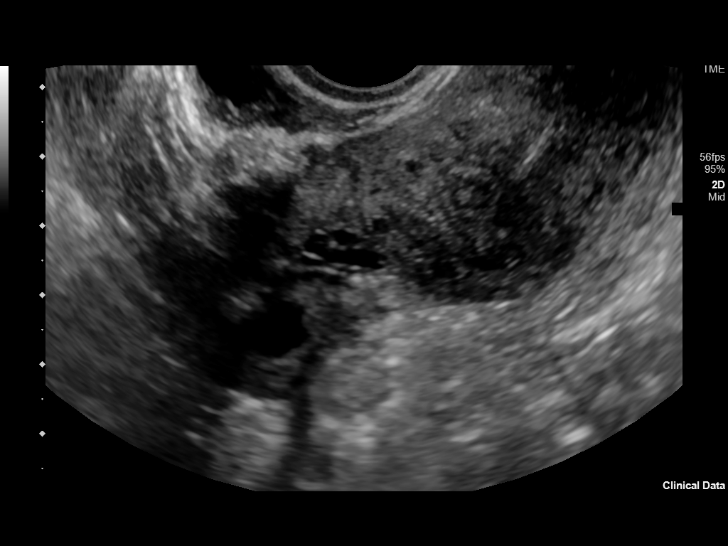
[im 116/127]
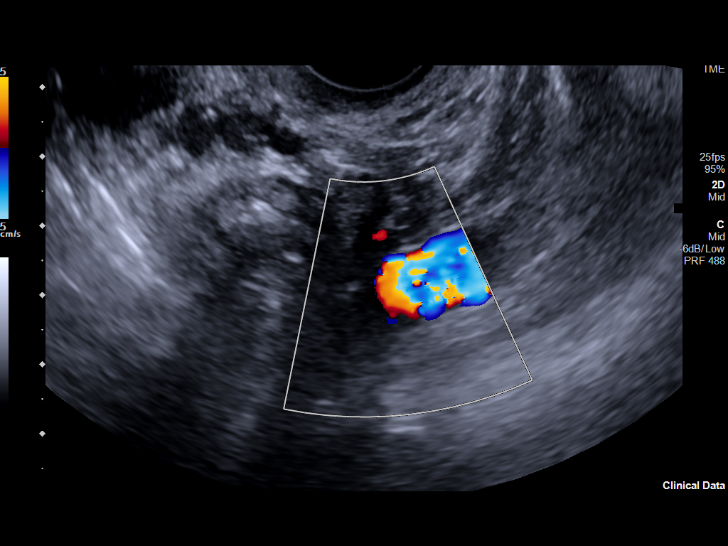
[im 127/127]
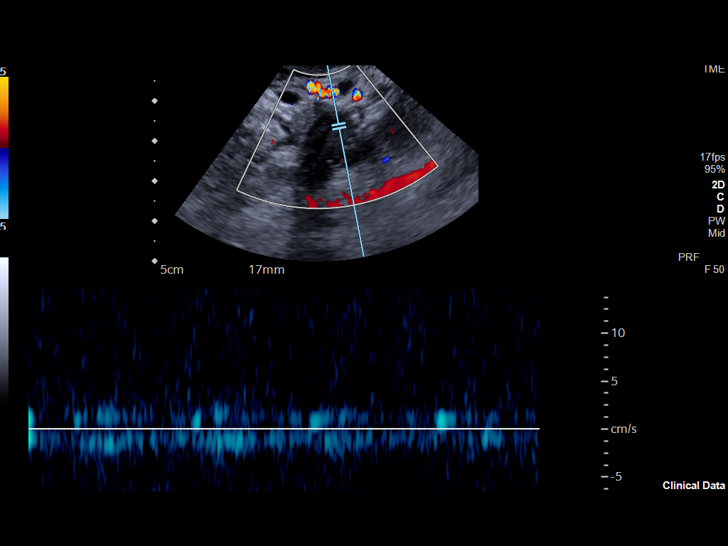

[13 of 25 positions shown; findings below may reference images not displayed]

FINDINGS: Uterus

Measurements: 9.3 x 4.9 x 5.4 cm = volume: 129 high mL. Anterior
right fundal fibroid, possibly with subserosal extension. This
measures approximately 2 cm in diameter.

Endometrium

Thickness: 4.6 mm.  No focal abnormality visualized.

Right ovary

Measurements: 2.6 x 2.1 x 2.1 cm = volume: 5.7 mL. Normal
appearance/no adnexal mass.

Left ovary

Measurements: 2.5 x 1.6 x 1.8 cm = volume: 3.7 mL. Normal
appearance/no adnexal mass.

Pulsed Doppler evaluation of both ovaries demonstrates normal
low-resistance arterial and venous waveforms.

Other findings

Trace amount of free fluid, not worrisome.
IMPRESSION: Ovaries appear normal with normal blood flow.

Approximately 2 cm in diameter fibroid at the anterior right fundus,
with extension towards the subserosal zone.

## 2020-12-10 ENCOUNTER — Other Ambulatory Visit: Payer: Self-pay

## 2020-12-10 ENCOUNTER — Emergency Department: Payer: Commercial Managed Care - PPO

## 2020-12-10 ENCOUNTER — Emergency Department
Admission: EM | Admit: 2020-12-10 | Discharge: 2020-12-10 | Disposition: A | Discharge status: Home / Self Care | Payer: Commercial Managed Care - PPO | Attending: Emergency Medicine | Admitting: Emergency Medicine

## 2020-12-10 DIAGNOSIS — R112 Nausea with vomiting, unspecified: Secondary | ICD-10-CM | POA: Diagnosis not present

## 2020-12-10 DIAGNOSIS — R1030 Lower abdominal pain, unspecified: Secondary | ICD-10-CM | POA: Diagnosis present

## 2020-12-10 DIAGNOSIS — N390 Urinary tract infection, site not specified: Secondary | ICD-10-CM

## 2020-12-10 LAB — URINALYSIS, COMPLETE (UACMP) WITH MICROSCOPIC
Bilirubin Urine: NEGATIVE
Glucose, UA: NEGATIVE mg/dL
Ketones, ur: NEGATIVE mg/dL
Nitrite: NEGATIVE
Protein, ur: 30 mg/dL — AB
RBC / HPF: >50 RBC/hpf — ABNORMAL HIGH (ref 0–5)
Specific Gravity, Urine: 1.019 (ref 1.005–1.030)
WBC, UA: >50 WBC/hpf — ABNORMAL HIGH (ref 0–5)
pH: 6 (ref 5.0–8.0)

## 2020-12-10 LAB — CBC
HCT: 36.5 % (ref 36.0–46.0)
Hemoglobin: 11.6 g/dL — ABNORMAL LOW (ref 12.0–15.0)
MCH: 24 pg — ABNORMAL LOW (ref 26.0–34.0)
MCHC: 31.8 g/dL (ref 30.0–36.0)
MCV: 75.6 fL — ABNORMAL LOW (ref 80.0–100.0)
Platelets: 332 10*3/uL (ref 150–400)
RBC: 4.83 MIL/uL (ref 3.87–5.11)
RDW: 14.9 % (ref 11.5–15.5)
WBC: 8.2 10*3/uL (ref 4.0–10.5)
nRBC: 0 % (ref 0.0–0.2)

## 2020-12-10 LAB — COMPREHENSIVE METABOLIC PANEL
ALT: 17 U/L (ref 0–44)
AST: 20 U/L (ref 15–41)
Albumin: 4 g/dL (ref 3.5–5.0)
Alkaline Phosphatase: 54 U/L (ref 38–126)
Anion gap: 8 (ref 5–15)
BUN: 13 mg/dL (ref 6–20)
CO2: 23 mmol/L (ref 22–32)
Calcium: 8.7 mg/dL — ABNORMAL LOW (ref 8.9–10.3)
Chloride: 110 mmol/L (ref 98–111)
Creatinine, Ser: 0.55 mg/dL (ref 0.44–1.00)
GFR, Estimated: >60 mL/min (ref >60)
Glucose, Bld: 98 mg/dL (ref 70–99)
Potassium: 3.9 mmol/L (ref 3.5–5.1)
Sodium: 141 mmol/L (ref 135–145)
Total Bilirubin: 0.4 mg/dL (ref 0.3–1.2)
Total Protein: 7.4 g/dL (ref 6.5–8.1)

## 2020-12-10 LAB — LIPASE, BLOOD: Lipase: 31 U/L (ref 11–51)

## 2020-12-10 LAB — POC URINE PREG, ED: Preg Test, Ur: NEGATIVE

## 2020-12-10 MED ORDER — ONDANSETRON 4 MG PO TBDP
4.0000 mg | ORAL_TABLET | Freq: Three times a day (TID) | ORAL | 0 refills | Status: DC | PRN
Start: 2020-12-10 — End: 2021-11-05

## 2020-12-10 MED ORDER — IBUPROFEN 600 MG PO TABS: 600.0000 mg | ORAL_TABLET | Freq: Four times a day (QID) | ORAL | 0 refills | Status: DC | PRN

## 2020-12-10 MED ORDER — ONDANSETRON HCL 4 MG/2ML IJ SOLN
4.0000 mg | Freq: Once | INTRAMUSCULAR | Status: AC
  Administered 2020-12-10: 4 mg via INTRAVENOUS
  Filled 2020-12-10: qty 2

## 2020-12-10 MED ORDER — SODIUM CHLORIDE 0.9 % IV SOLN
1.0000 g | Freq: Once | INTRAVENOUS | Status: AC
  Administered 2020-12-10: 1 g via INTRAVENOUS
  Filled 2020-12-10: qty 10

## 2020-12-10 MED ORDER — SODIUM CHLORIDE 0.9 % IV BOLUS
1000.0000 mL | Freq: Once | INTRAVENOUS | Status: AC
  Administered 2020-12-10: 1000 mL via INTRAVENOUS

## 2020-12-10 MED ORDER — KETOROLAC TROMETHAMINE 30 MG/ML IJ SOLN
15.0000 mg | Freq: Once | INTRAMUSCULAR | Status: AC
  Administered 2020-12-10: 15 mg via INTRAVENOUS
  Filled 2020-12-10: qty 1

## 2020-12-10 MED ORDER — PHENAZOPYRIDINE HCL 200 MG PO TABS: 200.0000 mg | ORAL_TABLET | Freq: Three times a day (TID) | ORAL | 0 refills | Status: AC | PRN

## 2020-12-10 MED ORDER — MORPHINE SULFATE (PF) 4 MG/ML IV SOLN
4.0000 mg | Freq: Once | INTRAVENOUS | Status: AC
  Administered 2020-12-10: 4 mg via INTRAVENOUS
  Filled 2020-12-10: qty 1

## 2020-12-10 MED ORDER — CEFDINIR 300 MG PO CAPS: 300.0000 mg | ORAL_CAPSULE | Freq: Two times a day (BID) | ORAL | 0 refills | Status: AC

## 2020-12-10 NOTE — ED Notes (Signed)
Pt C/O pain in pubic area of 10, dysuria, burning.

## 2020-12-10 NOTE — ED Provider Notes (Signed)
Patient is a 40 year old female here with recurrent UTI.  This is her third episode within the last several months.  CT renal stone study pending at time of signout.  This was reviewed by me and shows no evidence of hydro or urinary tract calculus.  Patient is hemodynamically stable.  No leukocytosis.  No evidence of sepsis systemically.  UA is concerning for UTI.  Patient has had no vaginal symptoms per report or symptoms to suggest PID.  Will treat with a longer course of antibiotics given the recurrence of her symptoms, send a urine culture, and have her follow-up as an outpatient with urology given her recurrent UTIs.  Return precautions given.  She feels better in the ED and is tolerating p.o.  Will give brief course of analgesics and antiemetics as well as an outpatient.   Duffy Bruce, MD 12/10/20 1640

## 2020-12-10 NOTE — ED Triage Notes (Signed)
Pt come with c/o sever pain when urinating and abdominal pain. Pt stats burning as well. Pt states some odor.

## 2020-12-10 NOTE — ED Provider Notes (Signed)
[facility] Emergency Department Provider Note ____________________________________________   Event Date/Time   First MD Initiated Contact with Patient 12/10/20 1353     (approximate)  I have reviewed the triage vital signs and the nursing notes.  HISTORY  Chief Complaint Abdominal Pain   HPI Stephanie Fischer is a 40 y.o. femalewho presents to the ED for evaluation of abd pain and dysuria.   Chart review indicates multiple UTIs in the past few months.  Most recently 1.5 months ago.  Staph saprophyticus on urine culture from 9/3 treated with ciprofloxacin.  Patient presents to the ED for evaluation of 1-2 days of recurrence of dysuria, foul-smelling urine, nausea and lower abdominal pain.  She reports feeling poorly in a generalized fashion throughout the day today with presyncopal dizziness upon standing, nausea and 2 episodes of nonbloody nonbilious emesis.  She reports severe dysuria and hematuria with sensation of incomplete emptying and urinary frequency.  She does report some left-sided flank pain.  Denies fevers, syncopal episodes, chest pain, shortness of breath or cough.  Past Medical History:  Diagnosis Date   Gestational diabetes    Gestational diabetes    Headache     Patient Active Problem List   Diagnosis Date Noted   Lipoma of back 12/30/2016    Past Surgical History:  Procedure Laterality Date   APPENDECTOMY     CESAREAN SECTION     LIPOMA EXCISION Bilateral 01/14/2017   Procedure: EXCISION LIPOMA;  Surgeon: Robert Bellow, MD;  Location: [facility] ORS;  Service: General;  Laterality: Bilateral;    Prior to Admission medications   Not on File    Allergies Patient has no known allergies.  No family history on file.  Social History Social History   Tobacco Use   Smoking status: Never   Smokeless tobacco: Never  Vaping Use   Vaping Use: Never used  Substance Use Topics   Alcohol use: No   Drug use: No    Review  of Systems  Constitutional: No fever/chills Eyes: No visual changes. ENT: No sore throat. Cardiovascular: Denies chest pain. Respiratory: Denies shortness of breath. Gastrointestinal:   No diarrhea.  No constipation. Positive for nausea, emesis and suprapubic abdominal pain. Genitourinary: Positive for for dysuria. Musculoskeletal: Negative for back pain. Skin: Negative for rash. Neurological: Negative for headaches, focal weakness or numbness.  ____________________________________________   PHYSICAL EXAM:  VITAL SIGNS: Vitals:   12/10/20 1112 12/10/20 1352  BP: 119/88 119/70  Pulse: 93 82  Resp: 19 19  Temp: 98 F (36.7 C) 98.5 F (36.9 C)  SpO2: 100% 100%    Constitutional: Alert and oriented.  Appears uncomfortable but in no acute distress. Eyes: Conjunctivae are normal. PERRL. EOMI. Head: Atraumatic. Nose: No congestion/rhinnorhea. Mouth/Throat: Mucous membranes are moist.  Oropharynx non-erythematous. Neck: No stridor. No cervical spine tenderness to palpation. Cardiovascular: Normal rate, regular rhythm. Grossly normal heart sounds.  Good peripheral circulation. Respiratory: Normal respiratory effort.  No retractions. Lungs CTAB. Gastrointestinal: Soft , nondistended Suprapubic tenderness with some voluntary guarding.  Upper abdomen is benign. Left-sided CVA tenderness is present.  None on the right. Musculoskeletal: No lower extremity tenderness nor edema.  No joint effusions. No signs of acute trauma. Neurologic:  Normal speech and language. No gross focal neurologic deficits are appreciated. No gait instability noted. Skin:  Skin is warm, dry and intact. No rash noted. Psychiatric: Mood and affect are normal. Speech and behavior are normal. ____________________________________________   LABS (all labs ordered are listed, but  only abnormal results are displayed)  Labs Reviewed  COMPREHENSIVE METABOLIC PANEL - Abnormal; Notable for the following components:       Result Value   Calcium 8.7 (*)    All other components within normal limits  CBC - Abnormal; Notable for the following components:   Hemoglobin 11.6 (*)    MCV 75.6 (*)    MCH 24.0 (*)    All other components within normal limits  URINALYSIS, COMPLETE (UACMP) WITH MICROSCOPIC - Abnormal; Notable for the following components:   Color, Urine YELLOW (*)    APPearance CLOUDY (*)    Hgb urine dipstick LARGE (*)    Protein, ur 30 (*)    Leukocytes,Ua LARGE (*)    RBC / HPF >50 (*)    WBC, UA >50 (*)    Bacteria, UA FEW (*)    All other components within normal limits  URINE CULTURE  LIPASE, BLOOD  POC URINE PREG, ED   ____________________________________________  12 Lead EKG   ____________________________________________  RADIOLOGY  ED MD interpretation: CT renal study pending  Official radiology report(s): No results found.  ____________________________________________   PROCEDURES and INTERVENTIONS  Procedure(s) performed (including Critical Care):  .1-3 Lead EKG Interpretation Performed by: Vladimir Crofts, MD Authorized by: Vladimir Crofts, MD     Interpretation: normal     ECG rate:  80   ECG rate assessment: normal     Rhythm: sinus rhythm     Ectopy: none     Conduction: normal    Medications  cefTRIAXone (ROCEPHIN) 1 g in sodium chloride 0.9 % 100 mL IVPB (1 g Intravenous New Bag/Given 12/10/20 1437)  sodium chloride 0.9 % bolus 1,000 mL (1,000 mLs Intravenous New Bag/Given 12/10/20 1437)  ondansetron (ZOFRAN) injection 4 mg (4 mg Intravenous Given 12/10/20 1434)    ____________________________________________   MDM / ED COURSE   40 year old female with recurrent UTIs presents to the ED with an acute UTI, possibly pyelonephritis.  She has normal vital signs, but is exhibiting systemic symptoms and presyncope.  Blood work without leukocytosis, and does demonstrate intact renal function.  Urine certainly with infectious features and will be sent for  culture.  Due to her presyncope and left-sided CVA tenderness, we will provide fluids, Rocephin and CT renal study to ensure no urologic obstruction and to assess for stigmata of pyelonephritis.  Patient's signed out to oncoming provider.  If responds well to medications and is improved on reassessment, outpatient management may be reasonable.     ____________________________________________   FINAL CLINICAL IMPRESSION(S) / ED DIAGNOSES  Final diagnoses:  Acute UTI     ED Discharge Orders     None        Odas Ozer   Note:  This document was prepared using Dragon voice recognition software and may include unintentional dictation errors.    Vladimir Crofts, MD 12/10/20 [phone]

## 2020-12-11 ENCOUNTER — Telehealth: Payer: Self-pay

## 2020-12-11 NOTE — Telephone Encounter (Signed)
Transition Care Management Unsuccessful Follow-up Telephone Call  Date of discharge and from where:  12/10/2020 from [facility]  Attempts:  1st Attempt  Reason for unsuccessful TCM follow-up call:  Left voice message

## 2020-12-12 NOTE — Telephone Encounter (Signed)
Transition Care Management Unsuccessful Follow-up Telephone Call  Date of discharge and from where:  12/10/2020 from [facility]  Attempts:  2nd Attempt  Reason for unsuccessful TCM follow-up call:  Left voice message

## 2020-12-13 LAB — URINE CULTURE: Culture: 50000 — AB

## 2020-12-13 NOTE — Telephone Encounter (Signed)
Transition Care Management Unsuccessful Follow-up Telephone Call  Date of discharge and from where:  12/10/2020 from [facility]  Attempts:  3rd Attempt  Reason for unsuccessful TCM follow-up call:  Unable to reach patient

## 2021-02-16 ENCOUNTER — Other Ambulatory Visit: Payer: Self-pay

## 2021-02-16 ENCOUNTER — Emergency Department
Admission: EM | Admit: 2021-02-16 | Discharge: 2021-02-16 | Disposition: A | Discharge status: Home / Self Care | Payer: Commercial Managed Care - PPO | Attending: Emergency Medicine | Admitting: Emergency Medicine

## 2021-02-16 ENCOUNTER — Encounter: Payer: Self-pay | Admitting: Emergency Medicine

## 2021-02-16 DIAGNOSIS — R109 Unspecified abdominal pain: Secondary | ICD-10-CM | POA: Insufficient documentation

## 2021-02-16 DIAGNOSIS — Z5321 Procedure and treatment not carried out due to patient leaving prior to being seen by health care provider: Secondary | ICD-10-CM | POA: Insufficient documentation

## 2021-02-16 DIAGNOSIS — R300 Dysuria: Secondary | ICD-10-CM | POA: Diagnosis not present

## 2021-02-16 DIAGNOSIS — R319 Hematuria, unspecified: Secondary | ICD-10-CM | POA: Insufficient documentation

## 2021-02-16 LAB — BASIC METABOLIC PANEL
Anion gap: 4 — ABNORMAL LOW (ref 5–15)
BUN: 14 mg/dL (ref 6–20)
CO2: 23 mmol/L (ref 22–32)
Calcium: 9.1 mg/dL (ref 8.9–10.3)
Chloride: 107 mmol/L (ref 98–111)
Creatinine, Ser: 0.5 mg/dL (ref 0.44–1.00)
GFR, Estimated: >60 mL/min (ref >60)
Glucose, Bld: 116 mg/dL — ABNORMAL HIGH (ref 70–99)
Potassium: 3.7 mmol/L (ref 3.5–5.1)
Sodium: 134 mmol/L — ABNORMAL LOW (ref 135–145)

## 2021-02-16 LAB — CBC
HCT: 36.2 % (ref 36.0–46.0)
Hemoglobin: 11.6 g/dL — ABNORMAL LOW (ref 12.0–15.0)
MCH: 24.8 pg — ABNORMAL LOW (ref 26.0–34.0)
MCHC: 32 g/dL (ref 30.0–36.0)
MCV: 77.5 fL — ABNORMAL LOW (ref 80.0–100.0)
Platelets: 332 10*3/uL (ref 150–400)
RBC: 4.67 MIL/uL (ref 3.87–5.11)
RDW: 15.5 % (ref 11.5–15.5)
WBC: 9.4 10*3/uL (ref 4.0–10.5)
nRBC: 0 % (ref 0.0–0.2)

## 2021-02-16 LAB — URINALYSIS, ROUTINE W REFLEX MICROSCOPIC
RBC / HPF: >50 RBC/hpf — ABNORMAL HIGH (ref 0–5)
Specific Gravity, Urine: >1.03 — ABNORMAL HIGH (ref 1.005–1.030)
WBC, UA: >50 WBC/hpf — ABNORMAL HIGH (ref 0–5)

## 2021-02-16 NOTE — ED Triage Notes (Signed)
Pt in with UTI symptoms, progressing x few days. States she last had UTI with completed course of abx 2 mo's ago. Temp 99.2, has been taking AZO at home. Reporting hematuria and some L flank pain

## 2021-03-21 DIAGNOSIS — H5213 Myopia, bilateral: Secondary | ICD-10-CM | POA: Diagnosis not present

## 2021-04-29 ENCOUNTER — Encounter: Payer: Self-pay | Admitting: *Deleted

## 2021-07-03 ENCOUNTER — Ambulatory Visit (INDEPENDENT_AMBULATORY_CARE_PROVIDER_SITE_OTHER): Payer: Commercial Managed Care - PPO | Admitting: Urology

## 2021-07-03 ENCOUNTER — Encounter: Payer: Self-pay | Admitting: Urology

## 2021-07-03 VITALS — BP 101/70 | HR 76 | Ht 64.0 in | Wt 147.0 lb

## 2021-07-03 DIAGNOSIS — N390 Urinary tract infection, site not specified: Secondary | ICD-10-CM

## 2021-07-03 DIAGNOSIS — N393 Stress incontinence (female) (male): Secondary | ICD-10-CM

## 2021-07-03 LAB — URINALYSIS, COMPLETE
Bilirubin, UA: NEGATIVE
Glucose, UA: NEGATIVE
Ketones, UA: NEGATIVE
Leukocytes,UA: NEGATIVE
Nitrite, UA: NEGATIVE
Protein,UA: NEGATIVE
RBC, UA: NEGATIVE
Specific Gravity, UA: 1.02 (ref 1.005–1.030)
Urobilinogen, Ur: 0.2 mg/dL (ref 0.2–1.0)
pH, UA: 7 (ref 5.0–7.5)

## 2021-07-03 LAB — MICROSCOPIC EXAMINATION
Bacteria, UA: NONE SEEN
RBC, Urine: NONE SEEN /hpf (ref 0–2)

## 2021-07-03 MED ORDER — NITROFURANTOIN MACROCRYSTAL 50 MG PO CAPS: 50.0000 mg | ORAL_CAPSULE | Freq: Every day | ORAL | 2 refills | Status: DC

## 2021-07-03 MED ORDER — CEPHALEXIN 500 MG PO CAPS: 500.0000 mg | ORAL_CAPSULE | Freq: Two times a day (BID) | ORAL | 0 refills | Status: AC

## 2021-07-03 NOTE — Patient Instructions (Signed)
D-mannose and cranberry  

## 2021-07-03 NOTE — Progress Notes (Signed)
? ?07/03/2021 ?9:05 AM  ? ?Stephanie Fischer ?[birth year] ?[id number] ? ?Referring provider: Mariana Arn, MD ?[place],  [place] [address] ? ?Chief Complaint  ?Patient presents with  ? Recurrent UTI  ? ? ?HPI: ?Stephanie Fischer is a 41 y.o. female referred for evaluation of recurrent UTIs. ? ?~ 12 month history of recurrent UTIs ?Record review remarkable for positive cultures Staph saprophyticus in July and August 2022; E. coli October 2022 and mixed flora in December 2022 and February 2023 ?She estimates she gets UTIs every 1-2 months.  Symptoms resolved on antibiotics ?Symptoms include frequency, urgency, dysuria and suprapubic pain ?She is sexually active but sees no relation of symptom onset within 1-2 days of intercourse ?No febrile UTIs ?She also complains of bothersome stress incontinence with loss of urine with coughing, sneezing or lifting ?2 previous pregnancies with C-section deliveries ?She had onset of symptoms 2 days ago and had cephalexin which she started with symptom improvement ?No perimenopausal symptoms; no previous hysterectomy/oophorectomy ? ? ?PMH: ?Past Medical History:  ?Diagnosis Date  ? Gestational diabetes   ? Gestational diabetes   ? Headache   ? ? ?Surgical History: ?Past Surgical History:  ?Procedure Laterality Date  ? APPENDECTOMY    ? CESAREAN SECTION    ? LIPOMA EXCISION Bilateral 01/14/2017  ? Procedure: EXCISION LIPOMA;  Surgeon: Robert Bellow, MD;  Location: [facility] ORS;  Service: General;  Laterality: Bilateral;  ? ? ?Home Medications:  ?Allergies as of 07/03/2021   ?No Known Allergies ?  ? ?  ?Medication List  ?  ? ?  ? Accurate as of Jul 03, 2021  9:05 AM. If you have any questions, ask your nurse or doctor.  ?  ?  ? ?  ? ?cephALEXin 500 MG capsule ?Commonly known as: Keflex ?Take 1 capsule (500 mg total) by mouth 2 (two) times daily for 5 days. ?Started by: Abbie Sons, MD ?  ?ibuprofen 600 MG tablet ?Commonly known as: ADVIL ?Take 1 tablet (600 mg  total) by mouth every 6 (six) hours as needed for moderate pain. ?  ?ondansetron 4 MG disintegrating tablet ?Commonly known as: Zofran ODT ?Take 1 tablet (4 mg total) by mouth every 8 (eight) hours as needed for nausea or vomiting. ?  ? ?  ? ? ?Allergies: No Known Allergies ? ?Family History: ?History reviewed. No pertinent family history. ? ?Social History:  reports that she has never smoked. She has never used smokeless tobacco. She reports that she does not drink alcohol and does not use drugs. ? ? ?Physical Exam: ?BP 101/70   Pulse 76   Ht '5\' 4"'$  (1.626 m)   Wt 147 lb (66.7 kg)   BMI 25.23 kg/m?   ?Constitutional:  Alert and oriented, No acute distress. ?HEENT: [place] AT, moist mucus membranes.  Trachea midline, no masses. ?Cardiovascular: No clubbing, cyanosis, or edema. ?Respiratory: Normal respiratory effort, no increased work of breathing. ?GI: Abdomen is soft, nontender, nondistended, no abdominal masses ?GU: No CVA tenderness ?Skin: No rashes, bruises or suspicious lesions. ?Neurologic: Grossly intact, no focal deficits, moving all 4 extremities. ?Psychiatric: Normal mood and affect. ? ?Laboratory Data: ? ?Urinalysis ?Dipstick/microscopy negative ? ? ?Pertinent Imaging: ?Images were personally reviewed and interpreted ? ?CT Renal Stone Study ? ?Narrative ?CLINICAL DATA:  UTI, concern for left pyelonephritis, eval urologic ?obstruction, signs of pyelo ? ?EXAM: ?CT ABDOMEN AND PELVIS WITHOUT CONTRAST ? ?TECHNIQUE: ?Multidetector CT imaging of the abdomen and pelvis was performed ?following the standard protocol without  IV contrast. ? ?COMPARISON:  04/07/2019 ? ?FINDINGS: ?Lower chest: No acute abnormality. ? ?Hepatobiliary: No focal liver abnormality is seen. No gallstones, ?gallbladder wall thickening, or biliary dilatation. ? ?Pancreas: Unremarkable. ? ?Spleen: Unremarkable. ? ?Adrenals/Urinary Tract: Adrenals are unremarkable. No ?hydronephrosis, renal calculus, or perinephric stranding. Ureters ?are  normal in caliber without calculus. Bladder is unremarkable. ? ?Stomach/Bowel: Stomach is within normal limits. Bowel is normal in ?caliber. ? ?Vascular/Lymphatic: No significant vascular abnormality on this ?noncontrast study. No enlarged lymph nodes. ? ?Reproductive: Uterus and bilateral adnexa are unremarkable. ? ?Other: No free fluid.  Abdominal wall is unremarkable. ? ?Musculoskeletal: No acute osseous abnormality. ? ?IMPRESSION: ?No hydronephrosis or urinary tract calculus. Suboptimal evaluation ?for pyelonephritis without contrast. ? ? ?Electronically Signed ?By: Macy Mis M.D. ?On: 12/10/2020 15:58 ? ? ?Assessment & Plan:   ? ?1.  Recurrent UTI ?Urinalysis today clear though she started antibiotic 2 days ago ?Additional 5-day course cephalexin sent to complete antibiotic course ?We discussed the evaluation and treatment of patients with recurrent UTIs at length.  Possible etiologies of recurrent infection include periurethral tissue atrophy in postmenopausal woman, constipation, sexual activity, incomplete emptying, anatomic abnormalities, and even genetic predisposition.  Finally, we discussed the role of perineal hygiene, timed voiding, adequate hydration, topical vaginal estrogen, cranberry prophylaxis, and low-dose antibiotic prophylaxis. ?2-3 month course of low-dose antibiotic prophylaxis.  Rx nitrofurantoin 50 mg sent to pharmacy to start after she completes her cephalexin course ?Recommended starting cranberry and/or D-mannose ?Follow-up 2-3 months ? ?2.  Stress urinary incontinence ?We discussed there are no effective medications for SUI ?Pelvic floor physical therapy and surgical options were discussed ?She requested a PT referral  ? ? ?Abbie Sons, MD ? ?[facility] ?[address] ?[place], [place] [address] ?[phone] ? ?

## 2021-09-03 ENCOUNTER — Telehealth: Payer: Self-pay | Admitting: Certified Nurse Midwife

## 2021-09-03 NOTE — Telephone Encounter (Signed)
Patient called and states that she had her IUD placed here almost 2 years ago and menstruation has been normal. Pt called today and states that she has been bleeding heavily for about 3wks now. Patient has scheduled an appointment to be seen on July 20th. Pt is asking if there is any medication that can be sent in to her pharmacy that will stop the bleeding until she can be seen. Please advise.

## 2021-09-04 ENCOUNTER — Telehealth: Payer: Self-pay | Admitting: Certified Nurse Midwife

## 2021-09-04 NOTE — Telephone Encounter (Signed)
Pt states "her cycle has lasted longer than 3 weeks , she has become dizzy and at times weak. Pt is requesting medication to stop the bleeding until she can be seen at her next appointment in 07/10 at [facility]. Please return call

## 2021-09-04 NOTE — Telephone Encounter (Signed)
Attempted to call pt., No mychart set up. Sent text/email link to activate mychart

## 2021-09-08 ENCOUNTER — Encounter: Payer: Self-pay | Admitting: Student

## 2021-09-08 ENCOUNTER — Ambulatory Visit (INDEPENDENT_AMBULATORY_CARE_PROVIDER_SITE_OTHER): Payer: Commercial Managed Care - PPO | Admitting: Student

## 2021-09-08 VITALS — BP 118/76 | HR 89 | Ht 64.0 in | Wt 162.4 lb

## 2021-09-08 DIAGNOSIS — Z3046 Encounter for surveillance of implantable subdermal contraceptive: Secondary | ICD-10-CM

## 2021-09-08 DIAGNOSIS — N939 Abnormal uterine and vaginal bleeding, unspecified: Secondary | ICD-10-CM

## 2021-09-08 MED ORDER — NORELGESTROMIN-ETH ESTRADIOL 150-35 MCG/24HR TD PTWK: 1.0000 | MEDICATED_PATCH | TRANSDERMAL | 12 refills | Status: DC

## 2021-09-08 NOTE — Progress Notes (Signed)
  History:  [name] is a 41 y.o. [id number] who presents to clinic today for encounter for Nexplanon surveillance. She would like her nexplanon removed and she wants to go on the patch. She is also interested in getting her tubes tied. She reports that her bleeding has gotten heavier and that she does not like it. She also reports that she does not want any other hormones or does not want to take a pill every day.    The following portions of the patient's history were reviewed and updated as appropriate: allergies, current medications, family history, past medical history, social history, past surgical history and problem list.  Review of Systems:  Review of Systems  Constitutional: Negative.   HENT: Negative.    Respiratory: Negative.    Cardiovascular: Negative.   Genitourinary: Negative.   Musculoskeletal: Negative.   Skin: Negative.   Neurological: Negative.   Psychiatric/Behavioral: Negative.        Objective:  Physical Exam BP 118/76   Pulse 89   Ht '5\' 4"'$  (1.626 m)   Wt 162 lb 6.4 oz (73.7 kg)   LMP 08/18/2021 (Approximate)   BMI 27.88 kg/m  Physical Exam Pulmonary:     Effort: Pulmonary effort is normal.  Abdominal:     General: Abdomen is flat.  Musculoskeletal:        General: Normal range of motion.  Skin:    General: Skin is warm.  Neurological:     General: No focal deficit present.     Mental Status: She is alert.  Psychiatric:        Mood and Affect: Mood normal.        Behavior: Behavior normal.       Labs and Imaging No results found for this or any previous visit (from the past 24 hour(s)).  No results found.  Health Maintenance Due  Topic Date Due   HIV Screening  Never done   Hepatitis C Screening  Never done   PAP SMEAR-Modifier  Never done   COVID-19 Vaccine (4 - Pfizer series) 05/02/2020    Labs, imaging and previous visits in Epic and Care Everywhere reviewed  Assessment & Plan:  1. Abnormal vaginal  bleeding  NEXPLANON INSERTION: Appropriate time out taken. Nexlanon site (left arm) identified and the area was prepped in usual sterile fashon. 2 cc of 1% lidocaine was used to anesthetize the area starting with the distal end.   Next, the area was cleansed with betadine and the Nexplanon was inserted without difficulty.  Pressure bandage was applied.  Pt was instructed to remove pressure bandage in a few hours, and keep insertion site covered with a bandaid for 3 days.   -RX for patch sent, explained to patient how to use -patient wants to get BTL; patient will schedule surgical follow-up.   Approximately 25 minutes of total time was spent with this patient on care and exam.   [place]arr [place], [place] 09/08/2021 5:01 PM

## 2021-09-09 ENCOUNTER — Telehealth: Payer: Self-pay | Admitting: Certified Nurse Midwife

## 2021-09-09 NOTE — Telephone Encounter (Signed)
Pt is requesting RX for [facility] patch and RX to stop her cycle. PT uses Pharmacy : [facility] [address]. Please return call

## 2021-09-11 ENCOUNTER — Telehealth: Payer: Self-pay | Admitting: Student

## 2021-09-11 NOTE — Telephone Encounter (Signed)
Hi Taylor! Ok, so, she got her [facility] then, right? That's what's going to stop her bleeding:)

## 2021-09-11 NOTE — Telephone Encounter (Signed)
Pt was able to pick up the RX for [facility]. Pt is requesting the RX to stop her menstruation due to bleeding. Pt states she has been bleeding for 3 or more weeks.

## 2021-09-12 NOTE — Telephone Encounter (Signed)
Spoke with patient this morning, she called in to report she is still bleeding. Inquired about BC patch, she states starting it yesterday. Advised patient it can take more than 1 day to completely stop the bleeding. She voiced understanding but also frustration that she has been bleeding for over 3 weeks. Advised patient to call back next week if she continues to have issues bleeding.

## 2021-09-16 NOTE — Telephone Encounter (Signed)
Made in error

## 2021-09-18 ENCOUNTER — Encounter: Payer: Commercial Managed Care - PPO | Admitting: Obstetrics

## 2021-10-02 ENCOUNTER — Encounter: Payer: Self-pay | Admitting: Obstetrics and Gynecology

## 2021-10-03 ENCOUNTER — Ambulatory Visit: Payer: Commercial Managed Care - PPO | Admitting: Urology

## 2021-10-03 ENCOUNTER — Encounter: Payer: Self-pay | Admitting: Urology

## 2021-10-21 ENCOUNTER — Encounter: Payer: Self-pay | Admitting: Obstetrics and Gynecology

## 2021-10-21 ENCOUNTER — Ambulatory Visit (INDEPENDENT_AMBULATORY_CARE_PROVIDER_SITE_OTHER): Payer: Commercial Managed Care - PPO | Admitting: Obstetrics and Gynecology

## 2021-10-21 VITALS — BP 107/74 | HR 85 | Ht 64.0 in | Wt 163.6 lb

## 2021-10-21 DIAGNOSIS — Z3009 Encounter for other general counseling and advice on contraception: Secondary | ICD-10-CM

## 2021-10-21 DIAGNOSIS — Z01818 Encounter for other preprocedural examination: Secondary | ICD-10-CM

## 2021-10-21 NOTE — Progress Notes (Signed)
HPI:      [name] is a 41 y.o. [id number] who LMP was Patient's last menstrual period was 10/10/2021 (exact date).  Subjective:   She presents today because she has been considering different forms of birth control most especially permanent sterilization.  She is currently using patches.  She absolutely does not want to become pregnant. She has tried Nexplanon without success.    Hx: The following portions of the patient's history were reviewed and updated as appropriate:             She  has a past medical history of Gestational diabetes, Gestational diabetes, and Headache. She does not have any pertinent problems on file. She  has a past surgical history that includes Appendectomy; Cesarean section; and Lipoma excision (Bilateral, 01/14/2017). Her family history is not on file. She  reports that she has never smoked. She has never used smokeless tobacco. She reports that she does not drink alcohol and does not use drugs. She has a current medication list which includes the following prescription(s): ibuprofen, norelgestromin-ethinyl estradiol, and ondansetron. She has No Known Allergies.       Review of Systems:  Review of Systems  Constitutional: Denied constitutional symptoms, night sweats, recent illness, fatigue, fever, insomnia and weight loss.  Eyes: Denied eye symptoms, eye pain, photophobia, vision change and visual disturbance.  Ears/Nose/Throat/Neck: Denied ear, nose, throat or neck symptoms, hearing loss, nasal discharge, sinus congestion and sore throat.  Cardiovascular: Denied cardiovascular symptoms, arrhythmia, chest pain/pressure, edema, exercise intolerance, orthopnea and palpitations.  Respiratory: Denied pulmonary symptoms, asthma, pleuritic pain, productive sputum, cough, dyspnea and wheezing.  Gastrointestinal: Denied, gastro-esophageal reflux, melena, nausea and vomiting.  Genitourinary: Denied genitourinary symptoms including symptomatic vaginal  discharge, pelvic relaxation issues, and urinary complaints.  Musculoskeletal: Denied musculoskeletal symptoms, stiffness, swelling, muscle weakness and myalgia.  Dermatologic: Denied dermatology symptoms, rash and scar.  Neurologic: Denied neurology symptoms, dizziness, headache, neck pain and syncope.  Psychiatric: Denied psychiatric symptoms, anxiety and depression.  Endocrine: Denied endocrine symptoms including hot flashes and night sweats.   Meds:   Current Outpatient Medications on File Prior to Visit  Medication Sig Dispense Refill   ibuprofen (ADVIL) 600 MG tablet Take 1 tablet (600 mg total) by mouth every 6 (six) hours as needed for moderate pain. 28 tablet 0   norelgestromin-ethinyl estradiol Stephanie Fischer) 150-35 MCG/24HR transdermal patch Place 1 patch onto the skin once a week. 3 patch 12   ondansetron (ZOFRAN ODT) 4 MG disintegrating tablet Take 1 tablet (4 mg total) by mouth every 8 (eight) hours as needed for nausea or vomiting. 20 tablet 0   No current facility-administered medications on file prior to visit.      Objective:     Vitals:   10/21/21 1531  BP: 107/74  Pulse: 85   Filed Weights   10/21/21 1531  Weight: 163 lb 9.6 oz (74.2 kg)                        Assessment:    [id number] Patient Active Problem List   Diagnosis Date Noted   Lipoma of back 12/30/2016     1. Pre-op exam   2. Birth control counseling        Plan:            1.  Discussed multiple birth control methods in detail.  Risk benefits of each reviewed.  Tubal ligation reviewed in detail.  After a fairly long discussion through an  interpreter patient would like to have an IUD placed.  If it does not go well and she is not happy with that she would then like permanent sterilization. She will return with her menses for IUD insertion on September 6. Orders No orders of the defined types were placed in this encounter.   No orders of the defined types were placed in this encounter.      F/U  No follow-ups on file. I spent 21 minutes involved in the care of this patient preparing to see the patient by obtaining and reviewing her medical history (including labs, imaging tests and prior procedures), documenting clinical information in the electronic health record (EHR), counseling and coordinating care plans, writing and sending prescriptions, ordering tests or procedures and in direct communicating with the patient and medical staff discussing pertinent items from her history and physical exam.  Finis Bud, M.D. 10/21/2021 4:12 PM

## 2021-10-21 NOTE — Progress Notes (Signed)
Patient presents today for a pre-op exam for BTL. She states wanting this surgery due to not wanting anymore children. Patient states questions regarding healing time and how quickly she could return to work after surgery. No other concerns at this time.

## 2021-11-05 ENCOUNTER — Ambulatory Visit (INDEPENDENT_AMBULATORY_CARE_PROVIDER_SITE_OTHER): Payer: Commercial Managed Care - PPO | Admitting: Obstetrics and Gynecology

## 2021-11-05 ENCOUNTER — Encounter: Payer: Self-pay | Admitting: Obstetrics and Gynecology

## 2021-11-05 VITALS — BP 101/72 | HR 85 | Ht 64.0 in | Wt 160.1 lb

## 2021-11-05 DIAGNOSIS — Z30430 Encounter for insertion of intrauterine contraceptive device: Secondary | ICD-10-CM | POA: Diagnosis not present

## 2021-11-05 DIAGNOSIS — Z3202 Encounter for pregnancy test, result negative: Secondary | ICD-10-CM

## 2021-11-05 LAB — POCT URINE PREGNANCY: Preg Test, Ur: NEGATIVE

## 2021-11-05 MED ORDER — LEVONORGESTREL 20 MCG/DAY IU IUD
1.0000 | INTRAUTERINE_SYSTEM | Freq: Once | INTRAUTERINE | Status: AC
  Administered 2021-11-05: 1 via INTRAUTERINE

## 2021-11-05 NOTE — Progress Notes (Signed)
Patient presents today for IUD insertion. Patient states no other questions or concerns.

## 2021-11-05 NOTE — Progress Notes (Signed)
HPI:      [name] is a 41 y.o. [id number] who LMP was Patient's last menstrual period was 11/03/2021 (exact date).  Subjective:   She presents today for IUD insertion.  She frequently has dysmenorrhea and very heavy menstrual bleeding with her cycle.  She would also like the IUD for birth control.    Hx: The following portions of the patient's history were reviewed and updated as appropriate:             She  has a past medical history of Gestational diabetes, Gestational diabetes, and Headache. She does not have any pertinent problems on file. She  has a past surgical history that includes Appendectomy; Cesarean section; and Lipoma excision (Bilateral, 01/14/2017). Her family history is not on file. She  reports that she has never smoked. She has never used smokeless tobacco. She reports that she does not drink alcohol and does not use drugs. She has a current medication list which includes the following prescription(s): ibuprofen. She has No Known Allergies.       Review of Systems:  Review of Systems  Constitutional: Denied constitutional symptoms, night sweats, recent illness, fatigue, fever, insomnia and weight loss.  Eyes: Denied eye symptoms, eye pain, photophobia, vision change and visual disturbance.  Ears/Nose/Throat/Neck: Denied ear, nose, throat or neck symptoms, hearing loss, nasal discharge, sinus congestion and sore throat.  Cardiovascular: Denied cardiovascular symptoms, arrhythmia, chest pain/pressure, edema, exercise intolerance, orthopnea and palpitations.  Respiratory: Denied pulmonary symptoms, asthma, pleuritic pain, productive sputum, cough, dyspnea and wheezing.  Gastrointestinal: Denied, gastro-esophageal reflux, melena, nausea and vomiting.  Genitourinary: Denied genitourinary symptoms including symptomatic vaginal discharge, pelvic relaxation issues, and urinary complaints.  Musculoskeletal: Denied musculoskeletal symptoms, stiffness, swelling,  muscle weakness and myalgia.  Dermatologic: Denied dermatology symptoms, rash and scar.  Neurologic: Denied neurology symptoms, dizziness, headache, neck pain and syncope.  Psychiatric: Denied psychiatric symptoms, anxiety and depression.  Endocrine: Denied endocrine symptoms including hot flashes and night sweats.   Meds:   Current Outpatient Medications on File Prior to Visit  Medication Sig Dispense Refill   ibuprofen (ADVIL) 600 MG tablet Take 1 tablet (600 mg total) by mouth every 6 (six) hours as needed for moderate pain. 28 tablet 0   No current facility-administered medications on file prior to visit.    Objective:     Vitals:   11/05/21 0931  BP: 101/72  Pulse: 85    Physical examination   Pelvic:   Vulva: Normal appearance.  No lesions.  Vagina: No lesions or abnormalities noted.  Support: Normal pelvic support.  Urethra No masses tenderness or scarring.  Meatus Normal size without lesions or prolapse.  Cervix: Normal appearance.  No lesions.  Anus: Normal exam.  No lesions.  Perineum: Normal exam.  No lesions.        Bimanual   Uterus: Normal size.  Non-tender.  Mobile.  AV.  Adnexae: No masses.  Non-tender to palpation.  Cul-de-sac: Negative for abnormality.   IUD Procedure Pt has read the booklet and signed the appropriate forms regarding the Mirena IUD.  All of her questions have been answered.   The cervix was cleansed with betadine solution.  After sounding the uterus and noting the position, the IUD was placed in the usual manner without problem.  The string was cut to the appropriate length.  The patient tolerated the procedure well.            [facility] # = [id number]   Assessment:  [id number] Patient Active Problem List   Diagnosis Date Noted   Lipoma of back 12/30/2016     1. Encounter for IUD insertion       Plan:             F/U  Return in about 4 weeks (around 12/03/2021) for For IUD f/u.  Finis Bud, M.D. 11/05/2021 9:55 AM

## 2021-11-10 ENCOUNTER — Encounter: Payer: Self-pay | Admitting: Obstetrics and Gynecology

## 2021-12-03 ENCOUNTER — Encounter: Payer: Self-pay | Admitting: Obstetrics and Gynecology

## 2021-12-10 ENCOUNTER — Ambulatory Visit: Payer: Commercial Managed Care - PPO | Admitting: Obstetrics and Gynecology

## 2021-12-10 DIAGNOSIS — Z30431 Encounter for routine checking of intrauterine contraceptive device: Secondary | ICD-10-CM

## 2021-12-23 ENCOUNTER — Ambulatory Visit: Payer: Commercial Managed Care - PPO | Admitting: Obstetrics and Gynecology

## 2021-12-23 DIAGNOSIS — Z30431 Encounter for routine checking of intrauterine contraceptive device: Secondary | ICD-10-CM

## 2021-12-24 ENCOUNTER — Telehealth: Payer: Self-pay

## 2021-12-24 NOTE — Telephone Encounter (Signed)
[facility] VOICEMAIL: Patient reports she has an appointment today, but doesn't have the new address. Requesting return call. [phone]

## 2021-12-25 NOTE — Telephone Encounter (Signed)
Patient is scheduled for 01/05/22 with Dr. Rebekah Chesterfield

## 2022-01-06 ENCOUNTER — Encounter: Payer: Self-pay | Admitting: Obstetrics and Gynecology

## 2022-01-06 ENCOUNTER — Ambulatory Visit (INDEPENDENT_AMBULATORY_CARE_PROVIDER_SITE_OTHER): Payer: Commercial Managed Care - PPO | Admitting: Obstetrics and Gynecology

## 2022-01-06 VITALS — BP 108/77 | HR 77 | Ht 64.0 in | Wt 162.6 lb

## 2022-01-06 DIAGNOSIS — Z30431 Encounter for routine checking of intrauterine contraceptive device: Secondary | ICD-10-CM

## 2022-01-06 NOTE — Progress Notes (Signed)
HPI:      [name] is a 41 y.o. [id number] who LMP was No LMP recorded. (Menstrual status: IUD).  Subjective:   She presents today for IUD check.  She reports that her bleeding has resolved.  She has not had a menses in 2 months.  She is very happy with this.  She does report that her partner felt the strings but that was a few months ago.    Hx: The following portions of the patient's history were reviewed and updated as appropriate:             She  has a past medical history of Gestational diabetes, Gestational diabetes, and Headache. She does not have any pertinent problems on file. She  has a past surgical history that includes Appendectomy; Cesarean section; and Lipoma excision (Bilateral, 01/14/2017). Her family history is not on file. She  reports that she has never smoked. She has never used smokeless tobacco. She reports that she does not drink alcohol and does not use drugs. She has a current medication list which includes the following prescription(s): ibuprofen and levonorgestrel. She has No Known Allergies.       Review of Systems:  Review of Systems  Constitutional: Denied constitutional symptoms, night sweats, recent illness, fatigue, fever, insomnia and weight loss.  Eyes: Denied eye symptoms, eye pain, photophobia, vision change and visual disturbance.  Ears/Nose/Throat/Neck: Denied ear, nose, throat or neck symptoms, hearing loss, nasal discharge, sinus congestion and sore throat.  Cardiovascular: Denied cardiovascular symptoms, arrhythmia, chest pain/pressure, edema, exercise intolerance, orthopnea and palpitations.  Respiratory: Denied pulmonary symptoms, asthma, pleuritic pain, productive sputum, cough, dyspnea and wheezing.  Gastrointestinal: Denied, gastro-esophageal reflux, melena, nausea and vomiting.  Genitourinary: Denied genitourinary symptoms including symptomatic vaginal discharge, pelvic relaxation issues, and urinary complaints.   Musculoskeletal: Denied musculoskeletal symptoms, stiffness, swelling, muscle weakness and myalgia.  Dermatologic: Denied dermatology symptoms, rash and scar.  Neurologic: Denied neurology symptoms, dizziness, headache, neck pain and syncope.  Psychiatric: Denied psychiatric symptoms, anxiety and depression.  Endocrine: Denied endocrine symptoms including hot flashes and night sweats.   Meds:   Current Outpatient Medications on File Prior to Visit  Medication Sig Dispense Refill   ibuprofen (ADVIL) 600 MG tablet Take 1 tablet (600 mg total) by mouth every 6 (six) hours as needed for moderate pain. 28 tablet 0   levonorgestrel (MIRENA) 20 MCG/DAY IUD 1 each by Intrauterine route once.     No current facility-administered medications on file prior to visit.      Objective:     Vitals:   01/06/22 1543  BP: 108/77  Pulse: 77   Filed Weights   01/06/22 1543  Weight: 162 lb 9.6 oz (73.8 kg)              Physical examination   Pelvic:   Vulva: Normal appearance.  No lesions.  Vagina: No lesions or abnormalities noted.  Support: Normal pelvic support.  Urethra No masses tenderness or scarring.  Meatus Normal size without lesions or prolapse.  Cervix: Normal appearance.  No lesions. IUD strings noted inside cervical os.  Found with a small tonsil clamp  Anus: Normal exam.  No lesions.  Perineum: Normal exam.  No lesions.        Bimanual   Uterus: Normal size.  Non-tender.  Mobile.  AV.  Adnexae: No masses.  Non-tender to palpation.  Cul-de-sac: Negative for abnormality.             Assessment:  [id number] Patient Active Problem List   Diagnosis Date Noted   Lipoma of back 12/30/2016     1. Encounter for routine checking of intrauterine contraceptive device (IUD)     Patient doing well with IUD-bleeding controlled.   Plan:            1.  Follow-up for annual examination. Orders No orders of the defined types were placed in this encounter.   No orders of the  defined types were placed in this encounter.     F/U  No follow-ups on file. I spent 21 minutes involved in the care of this patient preparing to see the patient by obtaining and reviewing her medical history (including labs, imaging tests and prior procedures), documenting clinical information in the electronic health record (EHR), counseling and coordinating care plans, writing and sending prescriptions, ordering tests or procedures and in direct communicating with the patient and medical staff discussing pertinent items from her history and physical exam.  Finis Bud, M.D. 01/06/2022 4:00 PM

## 2022-01-06 NOTE — Progress Notes (Signed)
Patient presents today for IUD string check. She states no longer bleeding . Patient reports occasional cramping. She states partner voices discomfort with intercourse. No other questions or concerns.

## 2022-07-13 ENCOUNTER — Ambulatory Visit (INDEPENDENT_AMBULATORY_CARE_PROVIDER_SITE_OTHER): Payer: Commercial Managed Care - PPO

## 2022-07-13 ENCOUNTER — Ambulatory Visit (INDEPENDENT_AMBULATORY_CARE_PROVIDER_SITE_OTHER): Payer: Commercial Managed Care - PPO | Admitting: Obstetrics and Gynecology

## 2022-07-13 ENCOUNTER — Encounter: Payer: Self-pay | Admitting: Obstetrics and Gynecology

## 2022-07-13 VITALS — BP 110/70 | Ht 64.0 in | Wt 165.0 lb

## 2022-07-13 DIAGNOSIS — N921 Excessive and frequent menstruation with irregular cycle: Secondary | ICD-10-CM | POA: Diagnosis not present

## 2022-07-13 DIAGNOSIS — Z30431 Encounter for routine checking of intrauterine contraceptive device: Secondary | ICD-10-CM

## 2022-07-13 DIAGNOSIS — Z30432 Encounter for removal of intrauterine contraceptive device: Secondary | ICD-10-CM | POA: Diagnosis not present

## 2022-07-13 DIAGNOSIS — T8332XA Displacement of intrauterine contraceptive device, initial encounter: Secondary | ICD-10-CM | POA: Diagnosis not present

## 2022-07-13 NOTE — Progress Notes (Signed)
Verlisa Vara, Ilona Sorrel, PA-C   Chief Complaint  Patient presents with   Pelvic Pain    Been bleeding for 1 month and past 3 day bad pelvic pain     HPI:      Ms. Lyan Biffle is a 42 y.o. [id number] whose LMP was No LMP recorded. (Menstrual status: IUD)., presents today for heavy bleeding for the past month and severe pelvic pain for 3 days. Mirena placed 11/05/21 for hx of dysmenorrhea and very heavy menstrual bleeding with her cycle, as well as birth control. Was having menses Q2 months but pain/bleeding changed recently. Changing pads every 30-60 min.   Patient Active Problem List   Diagnosis Date Noted   Lipoma of back 12/30/2016    Past Surgical History:  Procedure Laterality Date   APPENDECTOMY     CESAREAN SECTION     LIPOMA EXCISION Bilateral 01/14/2017   Procedure: EXCISION LIPOMA;  Surgeon: Earline Mayotte, MD;  Location: [facility];  Service: General;  Laterality: Bilateral;    History reviewed. No pertinent family history.  Social History   Socioeconomic History   Marital status: Legally Separated    Spouse name: Not on file   Number of children: Not on file   Years of education: Not on file   Highest education level: Not on file  Occupational History   Not on file  Tobacco Use   Smoking status: Never   Smokeless tobacco: Never  Vaping Use   Vaping Use: Never used  Substance and Sexual Activity   Alcohol use: No   Drug use: No   Sexual activity: Yes    Birth control/protection: I.U.D.  Other Topics Concern   Not on file  Social History Narrative   Not on file   Social Determinants of Health   Financial Resource Strain: Not on file  Food Insecurity: Not on file  Transportation Needs: Not on file  Physical Activity: Not on file  Stress: Not on file  Social Connections: Not on file  Intimate Partner Violence: Not on file    Outpatient Medications Prior to Visit  Medication Sig Dispense Refill   ibuprofen (ADVIL) 600 MG tablet Take 1  tablet (600 mg total) by mouth every 6 (six) hours as needed for moderate pain. 28 tablet 0   levonorgestrel (MIRENA) 20 MCG/DAY IUD 1 each by Intrauterine route once.     No facility-administered medications prior to visit.      ROS:  Review of Systems  Constitutional:  Negative for fever.  Gastrointestinal:  Negative for blood in stool, constipation, diarrhea, nausea and vomiting.  Genitourinary:  Positive for menstrual problem and pelvic pain. Negative for dyspareunia, dysuria, flank pain, frequency, hematuria, urgency, vaginal bleeding, vaginal discharge and vaginal pain.  Musculoskeletal:  Negative for back pain.  Skin:  Negative for rash.   BREAST: No symptoms   OBJECTIVE:   Vitals:  BP 110/70   Ht 5\' 4"  (1.626 m)   Wt 165 lb (74.8 kg)   BMI 28.32 kg/m   Physical Exam Vitals reviewed.  Constitutional:      Appearance: Normal appearance. She is well-developed.  Pulmonary:     Effort: Pulmonary effort is normal.  Genitourinary:    Labia:        Right: No rash, tenderness or lesion.        Left: No rash, tenderness or lesion.      Vagina: Bleeding present.     Cervix: No cervical bleeding.  Comments: HEAVY BLEEDING; IUD STRINGS NOT IN CX OS; PT TENDER ON EXAM Musculoskeletal:        General: Normal range of motion.     Cervical back: Normal range of motion.  Skin:    General: Skin is warm and dry.  Neurological:     General: No focal deficit present.     Mental Status: She is alert and oriented to person, place, and time.     Cranial Nerves: No cranial nerve deficit.  Psychiatric:        Mood and Affect: Mood normal.        Behavior: Behavior normal.        Thought Content: Thought content normal.        Judgment: Judgment normal.     Results:  ULTRASOUND REPORT   Location: [facility] Date of Service: 07/13/2022        Indications:Pelvic Pain IUP placement check Findings:  The uterus is anteverted and measures 9.64 x 4.81 x  4.06. Echo texture is homogenous without evidence of focal masses.   The Endometrium measures 2.49 mm. IUD not in correct position   Right Ovary not well seen Left Ovary not well seen Survey of the adnexa demonstrates no adnexal masses. There is no free fluid in the cul de sac.   Impression: 1. IUD appears to be out of position     Recommendations: 1.Clinical correlation with the patient's History and Physical Exam. 2. Removal of IUD recommended   Waldo Laine, RT  IUD Removal Strings of IUD identified and grasped.  IUD removed without problem with Boseman forceps.  Pt tolerated this well.  IUD noted to be intact.  Assessment/Plan: Menorrhagia with irregular cycle - Plan: US PELVIS TRANSVAGINAL NON-OB (TV ONLY); IUD in LUS, pt very tender on exam, heavy bleeding on exam. IUD removed. Pt to f/u in 3 days to assess bleeding/pain; discuss BC options. Abstinence in meantime. Instructed pt to go to ED tonight if bleeding/pain worse than before IUD removal since IUD arm was embedded in uterine cavity at C/S scar. I called Dr. Valentino Saxon on call and notified her and explained case.   Encounter for routine checking of intrauterine contraceptive device (IUD) - Plan: US PELVIS TRANSVAGINAL NON-OB (TV ONLY)  Malpositioned intrauterine device (IUD), initial encounter  Encounter for IUD removal    Return for 11:15 same day appt with me Thurs, 5/16 for bleeding f/u.  Reyden Smith B. Caytlin Better, PA-C 07/13/2022 4:59 PM

## 2022-07-14 ENCOUNTER — Telehealth: Payer: Self-pay

## 2022-07-14 MED ORDER — NORETHINDRONE ACETATE 5 MG PO TABS: 5.0000 mg | ORAL_TABLET | Freq: Every day | ORAL | 0 refills | Status: DC

## 2022-07-14 NOTE — Telephone Encounter (Signed)
Rx aygestin eRxd to stop the bleeding. Take 1 pill daily. Has pt taken any meds for pain? Can do 800  mg ibup (OTC) every 8 hrs, use heating pad. Keep f/u Thurs, f/u prn. (I discussed with Dr. Valentino Saxon.)

## 2022-07-14 NOTE — Telephone Encounter (Signed)
Via interpreter, Stephanie Fischer [phone], pt aware of ABC's instructions.

## 2022-07-14 NOTE — Telephone Encounter (Signed)
Pt calling; had IUD removed yesterday; is having a lot of pain and bleeding; was told to let provider know and come back into the clinic.  [phone]

## 2022-07-16 ENCOUNTER — Ambulatory Visit (INDEPENDENT_AMBULATORY_CARE_PROVIDER_SITE_OTHER): Payer: Commercial Managed Care - PPO | Admitting: Obstetrics and Gynecology

## 2022-07-16 ENCOUNTER — Encounter: Payer: Self-pay | Admitting: Obstetrics and Gynecology

## 2022-07-16 VITALS — BP 100/64 | Ht 64.0 in | Wt 165.0 lb

## 2022-07-16 DIAGNOSIS — R1031 Right lower quadrant pain: Secondary | ICD-10-CM | POA: Diagnosis not present

## 2022-07-16 DIAGNOSIS — N926 Irregular menstruation, unspecified: Secondary | ICD-10-CM | POA: Diagnosis not present

## 2022-07-16 NOTE — Progress Notes (Signed)
Stephanie Fischer, Stephanie Sorrel, PA-C   Chief Complaint  Patient presents with   Follow-up    Still having heavy bleeding and right side pelvic pain    HPI:      Stephanie Fischer is a 42 y.o. [id number] whose LMP was No LMP recorded., presents today for f/u after IUD removed May 13 due to malposition with excessive bleeding and pain. IUD arm was embedded in C/S scar per ultrasonographer. Pt still having sharp pains RT side of uterus with walking/lifting, no pain when sitting/lying down. Sx improved with NSAIDs/heating pad. Neg ovaries on GYN u/s a few days ago. Pt also still having bleeding, now with large clots. Bleeding is improved since IUD removal but still heavy (changing pads about every hour due to clots not full pad). No issues with bleeding at night, flow increases when up and moving around. Started aygestin 5 mg Tues.  Pt would ultimately like BTL.   Patient Active Problem List   Diagnosis Date Noted   Lipoma of back 12/30/2016    Past Surgical History:  Procedure Laterality Date   APPENDECTOMY     CESAREAN SECTION     LIPOMA EXCISION Bilateral 01/14/2017   Procedure: EXCISION LIPOMA;  Surgeon: Earline Mayotte, MD;  Location: [facility] OR[facility];  Service: General;  Laterality: Bilateral;    History reviewed. No pertinent family history.  Social History   Socioeconomic History   Marital status: Legally Separated    Spouse name: Not on file   Number of children: Not on file   Years of education: Not on file   Highest education level: Not on file  Occupational History   Not on file  Tobacco Use   Smoking status: Never   Smokeless tobacco: Never  Vaping Use   Vaping Use: Never used  Substance and Sexual Activity   Alcohol use: No   Drug use: No   Sexual activity: Yes    Birth control/protection: None  Other Topics Concern   Not on file  Social History Narrative   Not on file   Social Determinants of Health   Financial Resource Strain: Not on file  Food Insecurity:  Not on file  Transportation Needs: Not on file  Physical Activity: Not on file  Stress: Not on file  Social Connections: Not on file  Intimate Partner Violence: Not on file    Outpatient Medications Prior to Visit  Medication Sig Dispense Refill   ibuprofen (ADVIL) 600 MG tablet Take 1 tablet (600 mg total) by mouth every 6 (six) hours as needed for moderate pain. 28 tablet 0   norethindrone (AYGESTIN) 5 MG tablet Take 1 tablet (5 mg total) by mouth daily. 30 tablet 0   No facility-administered medications prior to visit.      ROS:  Review of Systems  Constitutional:  Negative for fever.  Gastrointestinal:  Negative for blood in stool, constipation, diarrhea, nausea and vomiting.  Genitourinary:  Positive for pelvic pain and vaginal bleeding. Negative for dyspareunia, dysuria, flank pain, frequency, hematuria, urgency, vaginal discharge and vaginal pain.  Musculoskeletal:  Negative for back pain.  Skin:  Negative for rash.   BREAST: No symptoms   OBJECTIVE:   Vitals:  BP 100/64   Ht 5\' 4"  (1.626 m)   Wt 165 lb (74.8 kg)   BMI 28.32 kg/m   Physical Exam Vitals reviewed.  Constitutional:      Appearance: She is well-developed.  Pulmonary:     Effort: Pulmonary effort is normal.  Genitourinary:    General: Normal vulva.     Pubic Area: No rash.      Labia:        Right: No rash, tenderness or lesion.        Left: No rash, tenderness or lesion.      Vagina: Bleeding present. No vaginal discharge, erythema or tenderness.     Cervix: Normal.     Uterus: Normal. Not enlarged and not tender.      Adnexa: Left adnexa normal.       Right: Tenderness present. No mass.         Left: No mass or tenderness.       Comments: FLOW IS LESS ON EXAM THAN 3 DAYS AGO, NO CLOTS Musculoskeletal:        General: Normal range of motion.     Cervical back: Normal range of motion.  Skin:    General: Skin is warm and dry.  Neurological:     General: No focal deficit present.      Mental Status: She is alert and oriented to person, place, and time.  Psychiatric:        Mood and Affect: Mood normal.        Behavior: Behavior normal.        Thought Content: Thought content normal.        Judgment: Judgment normal.    Assessment/Plan: Irregular bleeding--due to malpositioned IUD, removed 3 days ago, started on aygestin, flow is a little improved. Cont aygestin. Pt to message me via MyChart on Mon re: bleeding sx. If super heavy over wknd, pt to call MD on call. Once sx resolved, pt wants to have BTL.   RLQ abdominal pain--most likely due to IUD malposition and removal. Cont NSAIDs/heating pad. F/u prn. Neg ovaries on GYN u/s.     Return if symptoms worsen or fail to improve.  Hanaa Payes B. Wakisha Alberts, PA-C 07/16/2022 9:26 AM

## 2022-07-22 ENCOUNTER — Telehealth: Payer: Self-pay

## 2022-07-22 NOTE — Telephone Encounter (Signed)
Via [facility], interpreter [phone], pt states she is changing pads/tampons q2hrs; pain is on right side of belly button; 8/10 on pain scale.  Adv will let [facility] know; may take IBU 600mg  q6h or 800mg  q8h.

## 2022-07-22 NOTE — Telephone Encounter (Signed)
Pt calling; had IUD removed 2wks ago; give rx to stop bleeding but is still bleeding.  [phone]

## 2022-07-22 NOTE — Telephone Encounter (Signed)
How frequently changing pads/tampons? Any cramping/pain?

## 2022-07-22 NOTE — Telephone Encounter (Signed)
Increase aygestin to 10 mg daily (2 tabs) and schedule f/u with MD for further eval. Needs to be ASAP appt/work in if needed, not a couple months out. Thx

## 2022-07-23 MED ORDER — NORETHINDRONE ACETATE 5 MG PO TABS: 5.0000 mg | ORAL_TABLET | Freq: Every day | ORAL | 0 refills | Status: DC

## 2022-07-23 NOTE — Telephone Encounter (Signed)
Pt aware; requested refill on Aygestin; adv will send in refill; pt aware to watch for phone call from schedulers. Interpreter was [employer] #[id number]

## 2022-08-12 ENCOUNTER — Ambulatory Visit: Payer: Commercial Managed Care - PPO | Admitting: Obstetrics and Gynecology

## 2022-08-13 IMAGING — CT CT RENAL STONE PROTOCOL
2 of 4 series · 17 of 46 positions shown, 19 images · non-contrast
Comparison: 04/07/2019

CLINICAL DATA: UTI, concern for left pyelonephritis, eval urologic
obstruction, signs of pyelo

EXAM:
CT ABDOMEN AND PELVIS WITHOUT CONTRAST
TECHNIQUE: Multidetector CT imaging of the abdomen and pelvis was performed
following the standard protocol without IV contrast.

[Series 2: stone full standard · axial · 0.75mm/px · z∈[-860,-445]mm · 14 of 91 slices shown, 16 images]
[im 4/91  soft-tissue]
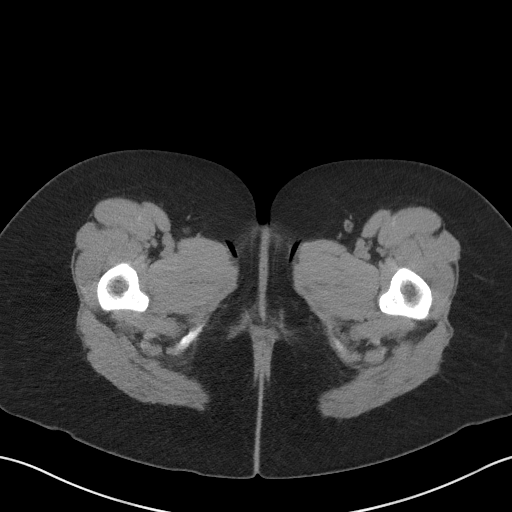
[im 4/91  bone]
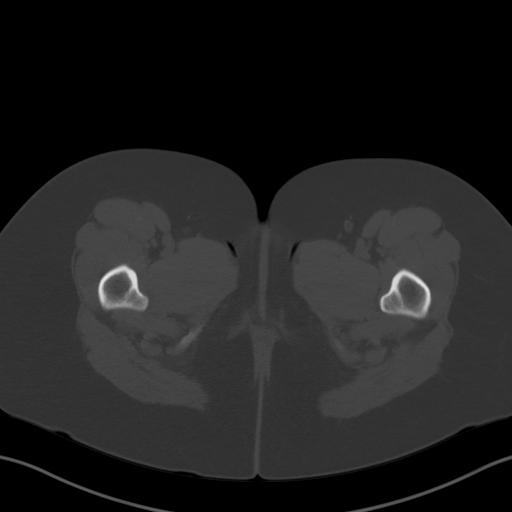
[im 11/91  soft-tissue]
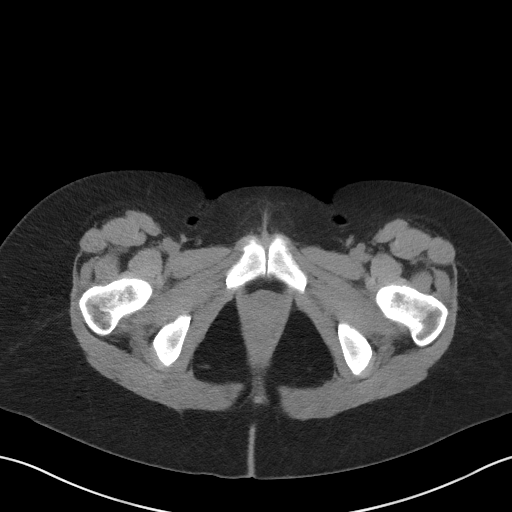
[im 19/91  soft-tissue]
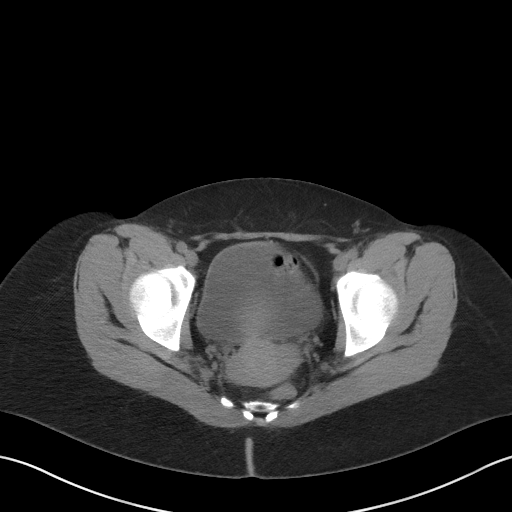
[im 26/91  soft-tissue]
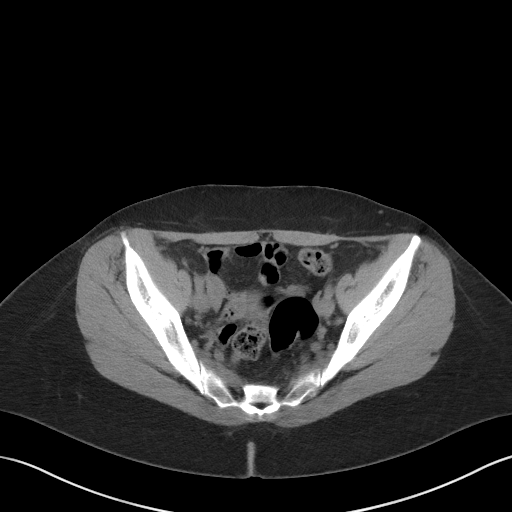
[im 29/91  soft-tissue]
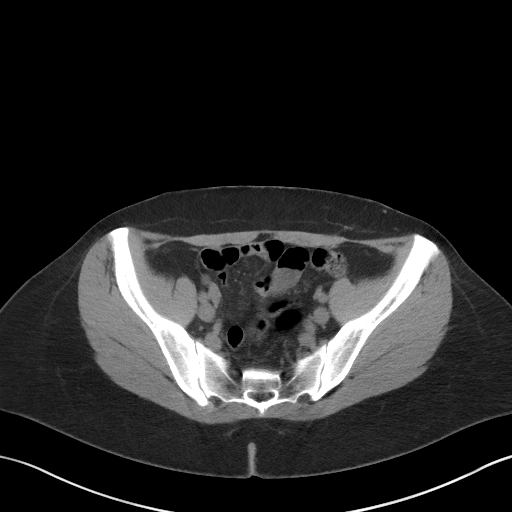
[im 37/91  soft-tissue]
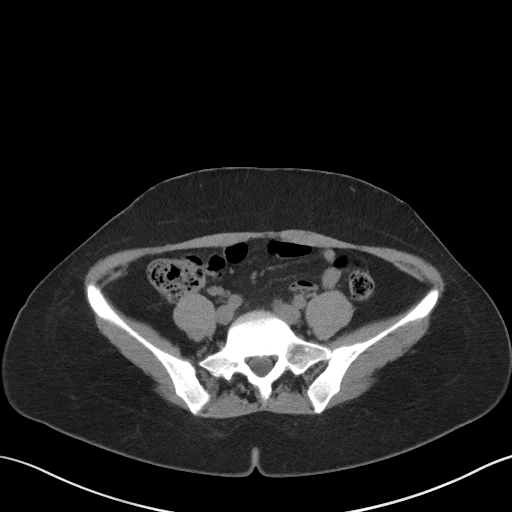
[im 44/91  soft-tissue]
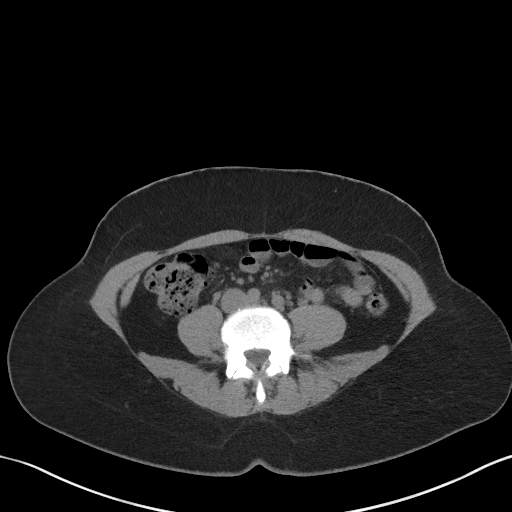
[im 47/91  soft-tissue]
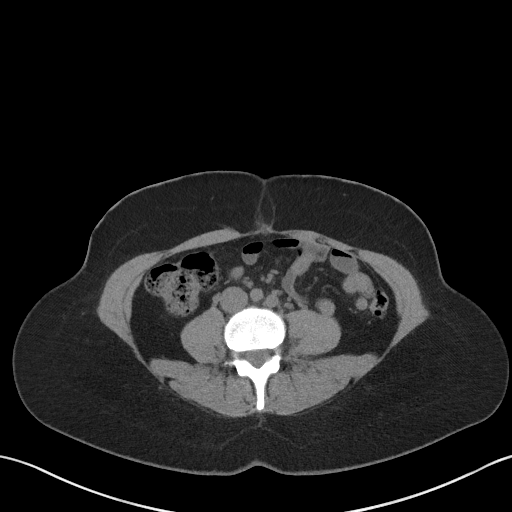
[im 55/91  soft-tissue]
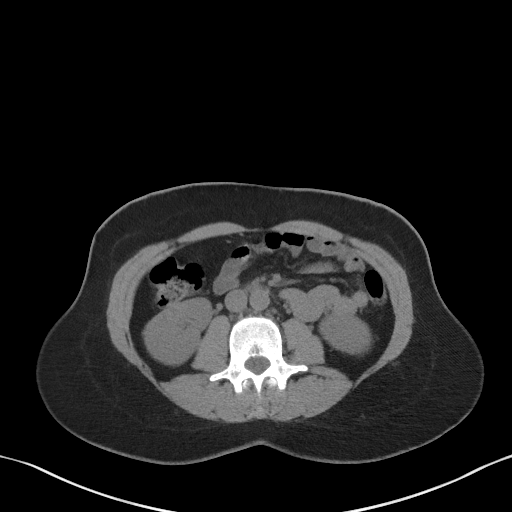
[im 55/91  bone]
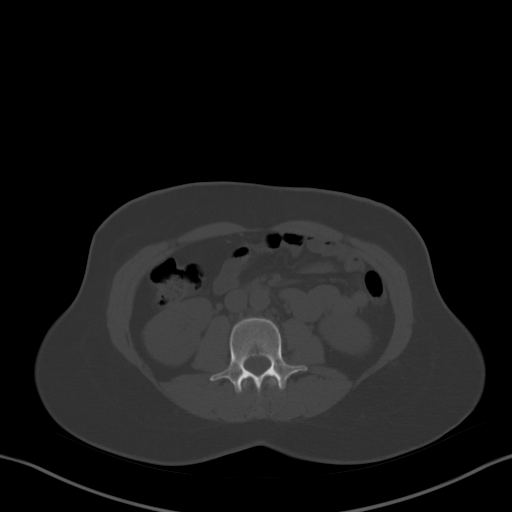
[im 62/91  soft-tissue]
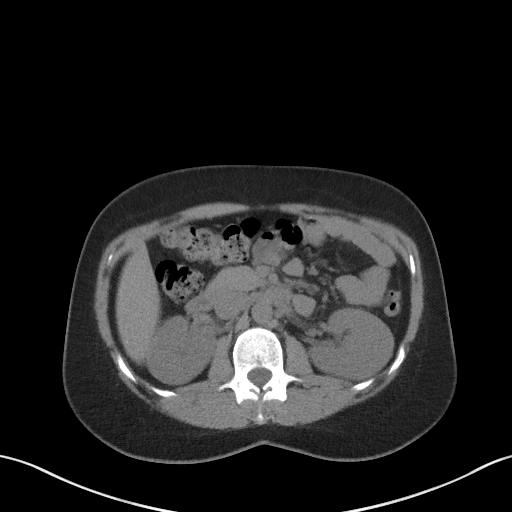
[im 69/91  soft-tissue]
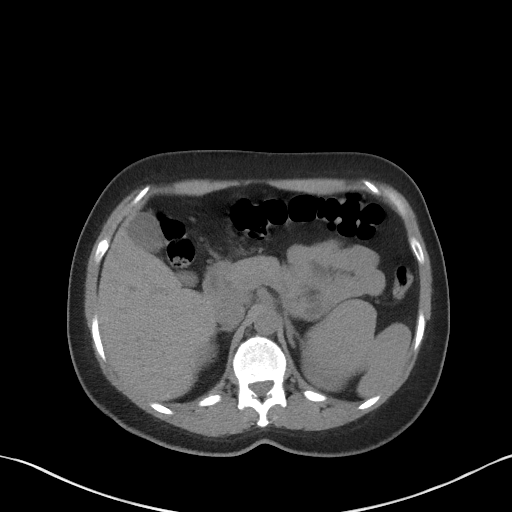
[im 73/91  soft-tissue]
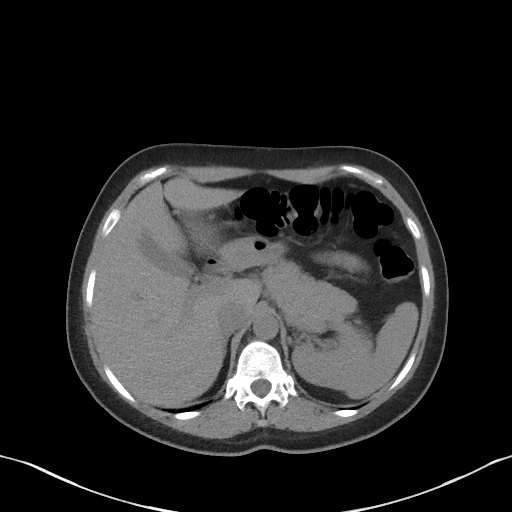
[im 80/91  soft-tissue]
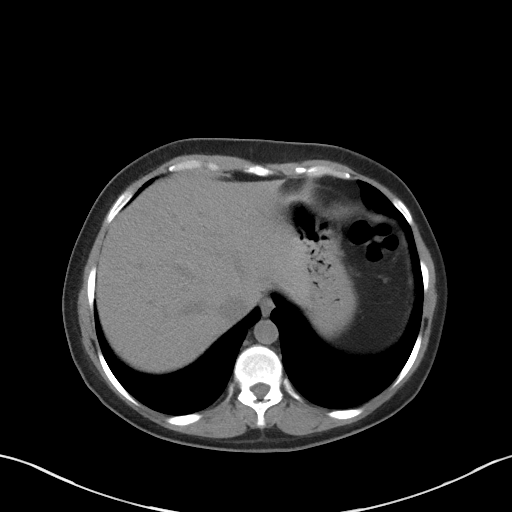
[im 87/91  soft-tissue]
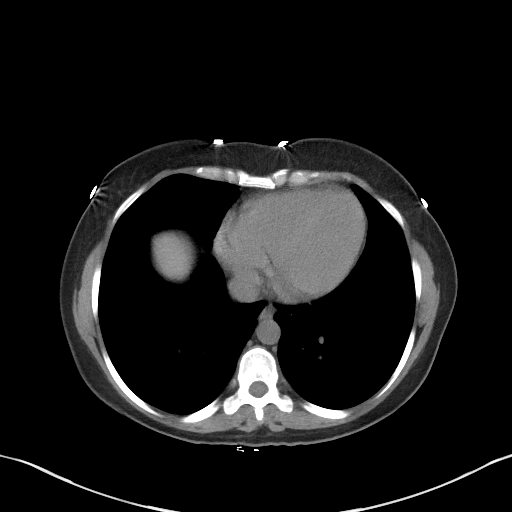

[Series 5: coronal · coronal · 0.85mm/px · 3 of 119 slices shown]
[im 40/119  soft-tissue]
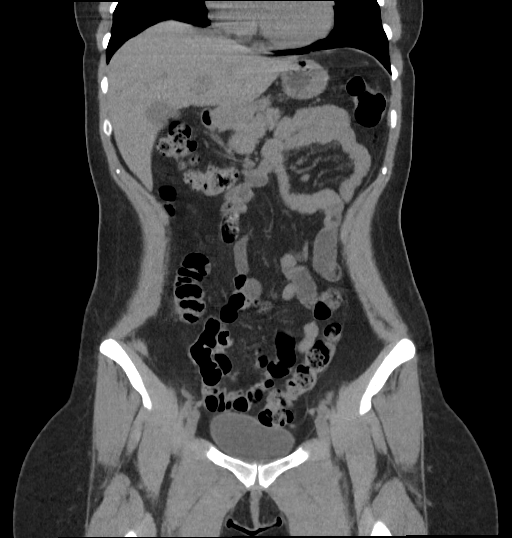
[im 53/119  soft-tissue]
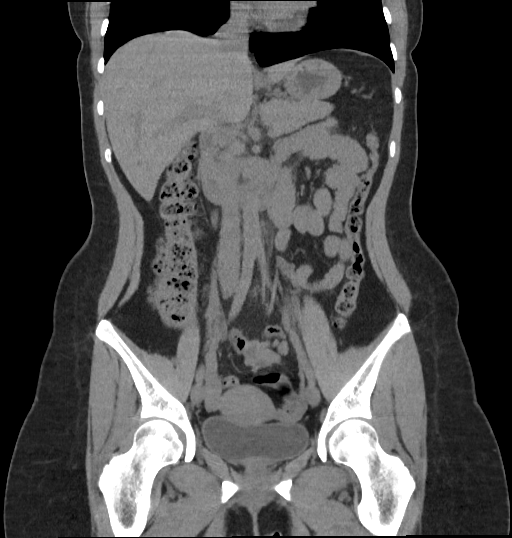
[im 66/119  soft-tissue]
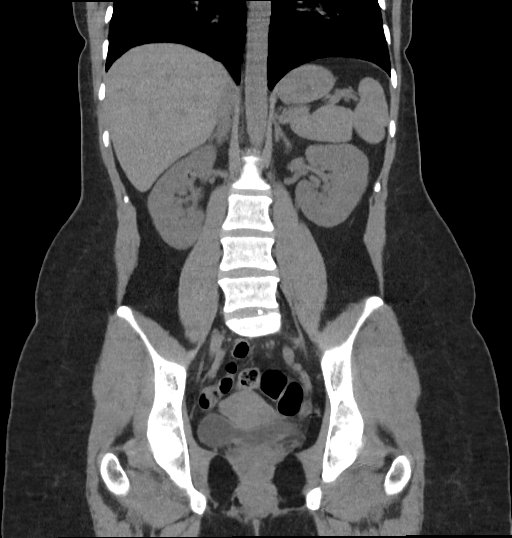

[17 of 46 positions shown; findings below may reference images not displayed]

FINDINGS: Lower chest: No acute abnormality.

Hepatobiliary: No focal liver abnormality is seen. No gallstones,
gallbladder wall thickening, or biliary dilatation.

Pancreas: Unremarkable.

Spleen: Unremarkable.

Adrenals/Urinary Tract: Adrenals are unremarkable. No
hydronephrosis, renal calculus, or perinephric stranding. Ureters
are normal in caliber without calculus. Bladder is unremarkable.

Stomach/Bowel: Stomach is within normal limits. Bowel is normal in
caliber.

Vascular/Lymphatic: No significant vascular abnormality on this
noncontrast study. No enlarged lymph nodes.

Reproductive: Uterus and bilateral adnexa are unremarkable.

Other: No free fluid.  Abdominal wall is unremarkable.

Musculoskeletal: No acute osseous abnormality.
IMPRESSION: No hydronephrosis or urinary tract calculus. Suboptimal evaluation
for pyelonephritis without contrast.

## 2022-09-07 ENCOUNTER — Other Ambulatory Visit: Payer: Self-pay | Admitting: Obstetrics and Gynecology

## 2022-09-14 ENCOUNTER — Other Ambulatory Visit: Payer: Self-pay

## 2022-09-14 DIAGNOSIS — N921 Excessive and frequent menstruation with irregular cycle: Secondary | ICD-10-CM

## 2022-09-14 MED ORDER — NORETHINDRONE ACETATE 5 MG PO TABS: 5.0000 mg | ORAL_TABLET | Freq: Every day | ORAL | 0 refills | Status: DC

## 2022-09-15 ENCOUNTER — Ambulatory Visit: Payer: Commercial Managed Care - PPO | Admitting: Obstetrics and Gynecology

## 2022-09-21 DIAGNOSIS — R300 Dysuria: Secondary | ICD-10-CM | POA: Diagnosis not present

## 2022-09-21 DIAGNOSIS — N3001 Acute cystitis with hematuria: Secondary | ICD-10-CM | POA: Diagnosis not present

## 2022-09-30 ENCOUNTER — Other Ambulatory Visit: Payer: Self-pay | Admitting: Obstetrics and Gynecology

## 2022-09-30 ENCOUNTER — Other Ambulatory Visit (HOSPITAL_COMMUNITY)
Admission: RE | Admit: 2022-09-30 | Discharge: 2022-09-30 | Disposition: A | Discharge status: Home / Self Care | Payer: Commercial Managed Care - PPO | Source: Ambulatory Visit | Attending: Obstetrics and Gynecology | Admitting: Obstetrics and Gynecology

## 2022-09-30 ENCOUNTER — Ambulatory Visit (INDEPENDENT_AMBULATORY_CARE_PROVIDER_SITE_OTHER): Payer: Commercial Managed Care - PPO | Admitting: Obstetrics and Gynecology

## 2022-09-30 ENCOUNTER — Encounter: Payer: Self-pay | Admitting: Obstetrics and Gynecology

## 2022-09-30 VITALS — BP 107/73 | HR 90 | Resp 16 | Ht 64.0 in | Wt 170.3 lb

## 2022-09-30 DIAGNOSIS — N921 Excessive and frequent menstruation with irregular cycle: Secondary | ICD-10-CM

## 2022-09-30 DIAGNOSIS — N898 Other specified noninflammatory disorders of vagina: Secondary | ICD-10-CM

## 2022-09-30 DIAGNOSIS — N926 Irregular menstruation, unspecified: Secondary | ICD-10-CM | POA: Insufficient documentation

## 2022-09-30 DIAGNOSIS — Z3009 Encounter for other general counseling and advice on contraception: Secondary | ICD-10-CM | POA: Diagnosis not present

## 2022-09-30 DIAGNOSIS — Z308 Encounter for other contraceptive management: Secondary | ICD-10-CM

## 2022-09-30 DIAGNOSIS — N3000 Acute cystitis without hematuria: Secondary | ICD-10-CM | POA: Diagnosis not present

## 2022-09-30 MED ORDER — NEXTSTELLIS 3-14.2 MG PO TABS: 1.0000 | ORAL_TABLET | Freq: Every day | ORAL | 4 refills | Status: DC

## 2022-09-30 NOTE — Progress Notes (Signed)
    GYNECOLOGY PROGRESS NOTE  Subjective:    Patient ID: Stephanie Fischer, female    DOB: 1980-04-07, 42 y.o.   MRN: [id number]  HPI  Patient is a 42 y.o. [id number] female who presents for irregular bleeding due to malpositioned IUD. She no longer has clots after IUD was removed, she is now having light spotting. She would like to discuss having a BTL.  The following portions of the patient's history were reviewed and updated as appropriate: allergies, current medications, past family history, past medical history, past social history, past surgical history, and problem list.  Review of Systems Pertinent items are noted in HPI.   Objective:   Blood pressure 107/73, pulse 90, resp. rate 16, height 5\' 4"  (1.626 m), weight 170 lb 4.8 oz (77.2 kg). Body mass index is 29.23 kg/m. General appearance: alert, cooperative, and no distress Abdomen: {abdominal exam:16834} Pelvic: {pelvic exam:16852::"cervix normal in appearance","external genitalia normal","no adnexal masses or tenderness","no cervical motion tenderness","rectovaginal septum normal","uterus normal size, shape, and consistency","vagina normal without discharge"} Extremities: {extremity exam:5109} Neurologic: {neuro exam:17854}   Assessment:   1. Irregular bleeding   2. Malpositioned intrauterine device (IUD), initial encounter      Plan:   There are no diagnoses linked to this encounter.    Hildred Laser, MD [facility]

## 2022-09-30 NOTE — Progress Notes (Signed)
GYNECOLOGY PROGRESS NOTE  Subjective:    Patient ID: Stephanie Fischer, female    DOB: October 14, 1980, 42 y.o.   MRN: [id number]  [facility] [place] interpreter present for today's visit.  HPI  Patient is a 42 y.o. G35P2002 female who presents for several issues. She is currently a patient of Alicia Copland, PA-C:   Menorrhagia with irregular cycle -patient reports that she has been having abnormal bleeding for the past year.  She has recently tried an IUD a few months ago to help with management however was beginning to note significant pain with the IUD.  Ultimately ended up having the IUD removed but is still experiencing fairly significant pelvic pressure and pain on her right side.  She also continued to have heavy irregular bleeding despite removal of the IUD.  Is currently on Aygestin which she initiated approximately 2 months ago.  Notes that the heavy bleeding and clotting have resolved, and is now down to light spotting.  However she complains of side effects such as hot flushes, racing heartbeat, and has noticed an increase in her weight of approximately 8 pounds over the past several months.  Consultation for tubal ligation -patient reports that she would like to consider tubal ligation for contraception.  Notes that she is certain that she no longer desires fertility.  Pelvic pain - patient also continues to note pelvic pain, as mentioned above, mostly in right lower quadrant.  She has had recent ultrasound performed which did not identify any structural causes for her pain.  Patient also notes that she has been dealing with persistent UTIs lately.  She does have a history of kidney issues including kidney stones.  Recently was treated by urgent care for UTI.  States that she had such significant pain that she missed work for approximately 3 days.  Would like FMLA papers completed for that period of leave.  Vaginal odor - patient notes that she has been experiencing a vaginal odor for  the.  She is unclear of what could be causing this however notes that he has become very self-conscious.  Cannot discharge but does issues with vaginal dryness since last round of antibiotics for her UTI several weeks ago.    The following portions of the patient's history were reviewed and updated as appropriate:  She  has a past medical history of Gestational diabetes, Gestational diabetes, and Headache.  She  has a past surgical history that includes Appendectomy; Cesarean section; and Lipoma excision (Bilateral, 01/14/2017).  She  reports that she has never smoked. She has never used smokeless tobacco. She reports that she does not drink alcohol and does not use drugs.  Current Outpatient Medications on File Prior to Visit  Medication Sig Dispense Refill   ibuprofen (ADVIL) 600 MG tablet Take 1 tablet (600 mg total) by mouth every 6 (six) hours as needed for moderate pain. 28 tablet 0   norethindrone (AYGESTIN) 5 MG tablet Take 1 tablet (5 mg total) by mouth daily. 30 tablet 0   No current facility-administered medications on file prior to visit.   She has No Known Allergies..  Review of Systems Pertinent items noted in HPI and remainder of comprehensive ROS otherwise negative.   Objective:   Blood pressure 107/73, pulse 90, resp. rate 16, height 5\' 4"  (1.626 m), weight 170 lb 4.8 oz (77.2 kg). Body mass index is 29.23 kg/m. General appearance: alert, cooperative, and no distress Abdomen:  Soft, mild to moderate tenderness in the lower abdomen midline  and right lower quadrant, no distention.  No organomegaly or masses palpable. Pelvic: external genitalia normal, rectovaginal septum normal.  Vagina with small amount of yellow-white discharge.  Cervix normal appearing, no lesions and no motion tenderness.  Uterus mobile, nontender, normal shape and size.  Adnexae non-palpable, but tenderness in right adnexa .  Extremities: extremities normal, atraumatic, no cyanosis or  edema Neurologic: Grossly normal   Labs:     Assessment:   1. Menorrhagia with irregular cycle   2. Vaginal odor   3. Acute cystitis without hematuria   4. Encounter for tubal ligation counseling      Plan:   1. Menorrhagia with irregular cycle -Discussion had with patient regarding further management of her menorrhagia.  Currently her bleeding has improved with use of the Aygestin however patient noting side effects that make her desire to discontinue the medication.  I discussed alternative options for management including use of continuous OCPs, as patient has been on several progesterone only methods recently including a Nexplanon, IUD and Depo-Provera.  She notes that she has tried utilization of the patch in the past and no issues with it however is unsure if she wants to resume that.  I discussed the option of utilizing combined OCPs for now in a continuous fashion.  Patient given sample supply of Nextstellis, was advised to take continuously.  Discussed that this particular medication has low risk of weight gain as a side effect.  Patient overall has no major risk factors that would be contraindications to estrogen use.  Gust the option of tubal ligation with an endometrial ablation being performed concurrently within the same surgical procedure.  Discussed risk versus benefits including cessation of bleeding, however due to patient's history of pelvic pain this could aggravate her pain further.  Patient notes that she would like to think about this and will utilize pills until she has made a decision.  2. Vaginal odor -Patient with a small amount of vaginal discharge noted on today's sample.  I discussed the possibility of vaginal infection that could be causing the odor, dryness patient has had a recent round of antibiotics which can lead to vaginitis.  Vaginal culture performed today. - Cervicovaginal ancillary only  3. Acute cystitis without hematuria -Recently treated for UTI  have still noting moderate symptoms.  UA performed evidence of UTI potential imaging of her bladder to assess for the possibility of bladder stones. -Desires FMLA paperwork to be completed on the dates that she was absent from work covering from her UTI and her pelvic pain.  I discussed that as this was my first time meeting to complete the forms since she was evaluated at the urgent care and we did not initially have follow-up for this problem.  However I advised that I would discussed with normal provider in the office to see if be completed.  I did however provide a work notice informing of her recent visit today and given an excuse for missing work during the dates quested (July 23 -26).  - POCT Urinalysis Dipstick  4. Encounter for tubal ligation counseling -Patient notes that she ultimately desires permanent sterilization.  As above noted, discussed different reversible birth control methods for her to ligation still would not help treat bleeding issues, and may still require use of hormonal manipulation unless she desired to undergo surgical intervention with an ablation.  Patient notes understanding.  Will be thinking about her options.  Notes that she would like to have the surgery performed closer towards  the last few months of the year. Can follow up at that time for pre-op.    Return if symptoms worsen or fail to improve. Return to clinic for any scheduled appointments or for any gynecologic concerns as needed.     A total of 45 minutes were spent during this encounter, including review of previous progress notes, recent imaging and labs, face-to-face with time with patient involving counseling and coordination of care, as well as documentation for current visit.   Hildred Laser, MD [facility]

## 2022-10-02 ENCOUNTER — Telehealth: Payer: Self-pay

## 2022-10-02 NOTE — Telephone Encounter (Signed)
I haven't seen pt since 5/24. If she discussed with Valentino Saxon 7/24 then I would see if Valentino Saxon would approve. Not sure why sent to me. Thx.

## 2022-10-02 NOTE — Telephone Encounter (Signed)
Pt left FMLA papers with Dr. Valentino Saxon at last visit to see if you approve getting them filled out? She was recently seen in urgent care for UTI and missed 3 days of work (in Dr. Oretha Milch note). Please advise so we can get these papers completed by Tresa Endo (she's our FMLA).

## 2022-10-05 ENCOUNTER — Other Ambulatory Visit: Payer: Commercial Managed Care - PPO

## 2022-10-05 MED ORDER — METRONIDAZOLE 500 MG PO TABS
500.0000 mg | ORAL_TABLET | Freq: Two times a day (BID) | ORAL | 0 refills | Status: DC
Start: 2022-10-05 — End: 2023-03-17

## 2022-10-05 NOTE — Telephone Encounter (Signed)
Hey Dr. Valentino Saxon,  So before I start these forms. I just want clarification that you are wanting me to approve those dates she was seen in the urgent care?

## 2022-10-05 NOTE — Telephone Encounter (Signed)
yes

## 2022-10-05 NOTE — Progress Notes (Signed)
I will sign them

## 2022-10-22 ENCOUNTER — Encounter: Payer: Self-pay | Admitting: Obstetrics and Gynecology

## 2022-11-18 DIAGNOSIS — R300 Dysuria: Secondary | ICD-10-CM | POA: Diagnosis not present

## 2022-11-18 DIAGNOSIS — N390 Urinary tract infection, site not specified: Secondary | ICD-10-CM | POA: Diagnosis not present

## 2022-12-01 ENCOUNTER — Ambulatory Visit: Payer: Commercial Managed Care - PPO | Admitting: Obstetrics and Gynecology

## 2022-12-01 NOTE — Progress Notes (Deleted)
    GYNECOLOGY PROGRESS NOTE  Subjective:    Patient ID: Stephanie Fischer, female    DOB: [birth year], 42 y.o.   MRN: [id number]  HPI  Patient is a 42 y.o. G67P2002 female who presents for evaluation of pelvic pain. She has been having ongoing pelvic pain x 3 months. At her previous visit we discussed tubal ligation with an endometrial ablation and also started her on samples of Nextstellis.  At that visit she stated that she wanted to think about surgical management.  {Common ambulatory SmartLinks:19316}  Review of Systems {ros; complete:30496}   Objective:   There were no vitals taken for this visit. There is no height or weight on file to calculate BMI. General appearance: {general exam:16600} Abdomen: {abdominal exam:16834} Pelvic: {pelvic exam:16852::"cervix normal in appearance","external genitalia normal","no adnexal masses or tenderness","no cervical motion tenderness","rectovaginal septum normal","uterus normal size, shape, and consistency","vagina normal without discharge"} Extremities: {extremity exam:5109} Neurologic: {neuro exam:17854}   Assessment:   1. Menorrhagia with irregular cycle   2. RLQ abdominal pain      Plan:   There are no diagnoses linked to this encounter.     Hildred Laser, MD [facility]

## 2022-12-14 DIAGNOSIS — Z03818 Encounter for observation for suspected exposure to other biological agents ruled out: Secondary | ICD-10-CM | POA: Diagnosis not present

## 2022-12-14 DIAGNOSIS — U071 COVID-19: Secondary | ICD-10-CM | POA: Diagnosis not present

## 2022-12-17 DIAGNOSIS — U071 COVID-19: Secondary | ICD-10-CM | POA: Diagnosis not present

## 2023-01-14 DIAGNOSIS — N390 Urinary tract infection, site not specified: Secondary | ICD-10-CM | POA: Diagnosis not present

## 2023-01-14 DIAGNOSIS — R300 Dysuria: Secondary | ICD-10-CM | POA: Diagnosis not present

## 2023-03-17 ENCOUNTER — Telehealth: Payer: Self-pay

## 2023-03-17 ENCOUNTER — Ambulatory Visit (INDEPENDENT_AMBULATORY_CARE_PROVIDER_SITE_OTHER): Payer: Commercial Managed Care - PPO | Admitting: Nurse Practitioner

## 2023-03-17 ENCOUNTER — Encounter: Payer: Self-pay | Admitting: Nurse Practitioner

## 2023-03-17 VITALS — BP 116/70 | HR 75 | Temp 97.8°F | Ht 61.0 in | Wt 171.4 lb

## 2023-03-17 DIAGNOSIS — Z1329 Encounter for screening for other suspected endocrine disorder: Secondary | ICD-10-CM | POA: Diagnosis not present

## 2023-03-17 DIAGNOSIS — Z1231 Encounter for screening mammogram for malignant neoplasm of breast: Secondary | ICD-10-CM

## 2023-03-17 DIAGNOSIS — E669 Obesity, unspecified: Secondary | ICD-10-CM | POA: Diagnosis not present

## 2023-03-17 DIAGNOSIS — F4310 Post-traumatic stress disorder, unspecified: Secondary | ICD-10-CM | POA: Diagnosis not present

## 2023-03-17 DIAGNOSIS — N390 Urinary tract infection, site not specified: Secondary | ICD-10-CM | POA: Diagnosis not present

## 2023-03-17 DIAGNOSIS — Z13220 Encounter for screening for lipoid disorders: Secondary | ICD-10-CM

## 2023-03-17 DIAGNOSIS — L259 Unspecified contact dermatitis, unspecified cause: Secondary | ICD-10-CM | POA: Diagnosis not present

## 2023-03-17 MED ORDER — DIPHENHYDRAMINE HCL 25 MG PO TABS: 25.0000 mg | ORAL_TABLET | Freq: Four times a day (QID) | ORAL | 0 refills | Status: DC | PRN

## 2023-03-17 MED ORDER — CETIRIZINE HCL 10 MG PO TABS: 10.0000 mg | ORAL_TABLET | Freq: Every day | ORAL | 11 refills | Status: DC

## 2023-03-17 NOTE — Assessment & Plan Note (Signed)
 Follow up with Psychiatry and therapist as scheduled. Continue current medication regimen. Counseled on importance of quality sleep.

## 2023-03-17 NOTE — Telephone Encounter (Signed)
 TRIAGE VOICEMAIL: Requesting refill of norethindrone -ethinyl estradiol  (LOESTRIN) 1-20 MG-MCG tablet/

## 2023-03-17 NOTE — Progress Notes (Signed)
 Bluford Burkitt, NP-C Phone: [phone]  Stephanie Fischer is a 43 y.o. female who presents today to establish care. Interpretor not present, disconnected prior to being seen. Patient comfortable with proceeding, able to speak and understand [place].   Discussed the use of AI scribe software for clinical note transcription with the patient, who gave verbal consent to proceed.  History of Present Illness   The patient, with a history of recurrent urinary tract infections (UTIs), reports that these episodes often occur after traveling or using public restrooms. She also notes that stress seems to trigger these UTIs. The patient admits to frequently holding her urine and acknowledges that this could be contributing to the recurrent infections. She also reports wiping back to front, which she now understands can introduce bacteria and lead to UTIs. The patient's last UTI was approximately one to two months ago. She denies any symptoms today.  The patient also expresses concern about her weight, noting a recent increase despite attempts at diet and exercise. She reports a significant amount of stress in her life, which she believes may be contributing to her weight gain. She expresses interest in using Ozempic for weight loss, but is concerned about the cost. The patient also mentions a change in her eating habits over the past three years, with an increased consumption of sweets and snacks.  Additionally, the patient reports a recent rash on her forehead that has been itchy for the past two days. She denies any changes in soaps, lotions, or makeup, but notes that she has been using chemicals at work. The patient also mentions a noticeable change in her posture, with a curve in her neck that has become more prominent over the past six months.  Lastly, the patient discloses that she is under a significant amount of stress and is currently seeing a psychiatrist and therapist for anxiety and PTSD. She  reports working long hours and attending school, which leaves her with little time for sleep. She has previously taken medication for anxiety but stopped due to concerns about her ability to maintain her job and school commitments.      Active Ambulatory Problems    Diagnosis Date Noted   Lipoma of back 12/30/2016   Recurrent UTI 03/17/2023   Obesity (BMI 30-39.9) 03/17/2023   Contact dermatitis 03/17/2023   PTSD (post-traumatic stress disorder) 03/17/2023   Resolved Ambulatory Problems    Diagnosis Date Noted   No Resolved Ambulatory Problems   Past Medical History:  Diagnosis Date   Gestational diabetes    Gestational diabetes    Headache     History reviewed. No pertinent family history.  Social History   Socioeconomic History   Marital status: Legally Separated    Spouse name: Not on file   Number of children: Not on file   Years of education: Not on file   Highest education level: Not on file  Occupational History   Not on file  Tobacco Use   Smoking status: Never   Smokeless tobacco: Never  Vaping Use   Vaping status: Never Used  Substance and Sexual Activity   Alcohol use: No   Drug use: No   Sexual activity: Yes    Birth control/protection: None  Other Topics Concern   Not on file  Social History Narrative   Not on file   Social Drivers of Health   Financial Resource Strain: Not on file  Food Insecurity: Not on file  Transportation Needs: Not on file  Physical Activity: Not on  file  Stress: Not on file  Social Connections: Not on file  Intimate Partner Violence: Not on file    ROS  General:  Negative for unexplained weight loss, fever Skin: Negative for new or changing mole, sore that won't heal HEENT: Negative for trouble hearing, trouble seeing, ringing in ears, mouth sores, hoarseness, change in voice, dysphagia. CV:  Negative for chest pain, dyspnea, edema, palpitations Resp: Negative for cough, dyspnea, hemoptysis GI: Negative for  nausea, vomiting, diarrhea, constipation, abdominal pain, melena, hematochezia. GU: Negative for dysuria, incontinence, urinary hesitance, hematuria, vaginal or penile discharge, polyuria, sexual difficulty, lumps in testicle or breasts MSK: Negative for muscle cramps or aches, joint pain or swelling Neuro: Negative for headaches, weakness, numbness, dizziness, passing out/fainting Psych: Negative for depression, anxiety, memory problems  Objective  Physical Exam Vitals:   03/17/23 1415  BP: 116/70  Pulse: 75  Temp: 97.8 F (36.6 C)  SpO2: 99%    BP Readings from Last 3 Encounters:  03/17/23 116/70  09/30/22 107/73  07/16/22 100/64   Wt Readings from Last 3 Encounters:  03/17/23 171 lb 6.4 oz (77.7 kg)  09/30/22 170 lb 4.8 oz (77.2 kg)  07/16/22 165 lb (74.8 kg)    Physical Exam Constitutional:      General: She is not in acute distress.    Appearance: Normal appearance.  HENT:     Head: Normocephalic.  Cardiovascular:     Rate and Rhythm: Normal rate and regular rhythm.     Heart sounds: Normal heart sounds.  Pulmonary:     Effort: Pulmonary effort is normal.     Breath sounds: Normal breath sounds.  Skin:    General: Skin is warm and dry.     Findings: Rash present. Rash is urticarial.     Comments: Left side of forehead- see pic below  Neurological:     General: No focal deficit present.     Mental Status: She is alert.  Psychiatric:        Mood and Affect: Mood normal.        Behavior: Behavior normal.      Assessment/Plan:   Contact dermatitis, unspecified contact dermatitis type, unspecified trigger Assessment & Plan: A new itchy rash on the left side of the forehead may be related to chemical exposure at work. Prescribed Benadryl  for nighttime and as needed use and Zyrtec  for daytime to alleviate itchiness and inflammation. Return precautions given to patient.   Orders: -     Cetirizine  HCl; Take 1 tablet (10 mg total) by mouth daily.  Dispense: 30  tablet; Refill: 11 -     diphenhydrAMINE  HCl; Take 1 tablet (25 mg total) by mouth every 6 (six) hours as needed for itching.  Dispense: 30 tablet; Refill: 0  Obesity (BMI 30-39.9) Assessment & Plan: There is concern about weight gain and difficulty losing weight despite diet and exercise efforts. Discussed the importance of reducing sweets and increasing protein intake. Considered the use of Wegovy , noting cost and insurance coverage issues. We will check lab work as outlined. Encouraged continued diet and exercise efforts. Wegovy  will be considered for weight loss after lab results and further discussion.  Orders: -     VITAMIN D  25 Hydroxy (Vit-D Deficiency, Fractures)  Recurrent UTI Assessment & Plan: Frequent UTIs are associated with travel and stress. Emphasized the importance of not holding urine, urinating after intercourse, and maintaining proper hygiene by wiping front to back. We will refer to Urology for further evaluation and management.  Orders: -     CBC with Differential/Platelet -     Comprehensive metabolic panel -     Ambulatory referral to Urology  PTSD (post-traumatic stress disorder) Assessment & Plan: Follow up with Psychiatry and therapist as scheduled. Continue current medication regimen. Counseled on importance of quality sleep.   Thyroid  disorder screen -     TSH -     Hemoglobin A1c  Lipid screening -     Lipid panel  Screening mammogram for breast cancer -     3D Screening Mammogram, Left and Right; Future   Return in about 3 months (around 06/15/2023).   Bluford Burkitt, NP-C [facility]

## 2023-03-17 NOTE — Assessment & Plan Note (Signed)
 Frequent UTIs are associated with travel and stress. Emphasized the importance of not holding urine, urinating after intercourse, and maintaining proper hygiene by wiping front to back. We will refer to Urology for further evaluation and management.

## 2023-03-17 NOTE — Telephone Encounter (Signed)
 Chart reviewed.Rx sent 10/01/22 with 1 yr refills. Left voicemail advised to contact pharmacy for refills.

## 2023-03-17 NOTE — Assessment & Plan Note (Signed)
 A new itchy rash on the left side of the forehead may be related to chemical exposure at work. Prescribed Benadryl  for nighttime and as needed use and Zyrtec  for daytime to alleviate itchiness and inflammation. Return precautions given to patient.

## 2023-03-17 NOTE — Assessment & Plan Note (Signed)
 There is concern about weight gain and difficulty losing weight despite diet and exercise efforts. Discussed the importance of reducing sweets and increasing protein intake. Considered the use of Wegovy , noting cost and insurance coverage issues. We will check lab work as outlined. Encouraged continued diet and exercise efforts. Wegovy  will be considered for weight loss after lab results and further discussion.

## 2023-03-18 LAB — COMPREHENSIVE METABOLIC PANEL
ALT: 23 U/L (ref 0–35)
AST: 22 U/L (ref 0–37)
Albumin: 4.4 g/dL (ref 3.5–5.2)
Alkaline Phosphatase: 60 U/L (ref 39–117)
BUN: 16 mg/dL (ref 6–23)
CO2: 28 meq/L (ref 19–32)
Calcium: 9.9 mg/dL (ref 8.4–10.5)
Chloride: 101 meq/L (ref 96–112)
Creatinine, Ser: 0.58 mg/dL (ref 0.40–1.20)
GFR: 110.77 mL/min (ref >60.00)
Glucose, Bld: 84 mg/dL (ref 70–99)
Potassium: 3.9 meq/L (ref 3.5–5.1)
Sodium: 138 meq/L (ref 135–145)
Total Bilirubin: 0.4 mg/dL (ref 0.2–1.2)
Total Protein: 7.9 g/dL (ref 6.0–8.3)

## 2023-03-18 LAB — CBC WITH DIFFERENTIAL/PLATELET
Basophils Absolute: 0.1 10*3/uL (ref 0.0–0.1)
Basophils Relative: 0.7 % (ref 0.0–3.0)
Eosinophils Absolute: 0.1 10*3/uL (ref 0.0–0.7)
Eosinophils Relative: 1.3 % (ref 0.0–5.0)
HCT: 40.1 % (ref 36.0–46.0)
Hemoglobin: 13.2 g/dL (ref 12.0–15.0)
Lymphocytes Relative: 34.5 % (ref 12.0–46.0)
Lymphs Abs: 2.6 10*3/uL (ref 0.7–4.0)
MCHC: 33 g/dL (ref 30.0–36.0)
MCV: 83.6 fL (ref 78.0–100.0)
Monocytes Absolute: 0.7 10*3/uL (ref 0.1–1.0)
Monocytes Relative: 8.9 % (ref 3.0–12.0)
Neutro Abs: 4.2 10*3/uL (ref 1.4–7.7)
Neutrophils Relative %: 54.6 % (ref 43.0–77.0)
Platelets: 360 10*3/uL (ref 150.0–400.0)
RBC: 4.8 Mil/uL (ref 3.87–5.11)
RDW: 12.7 % (ref 11.5–15.5)
WBC: 7.6 10*3/uL (ref 4.0–10.5)

## 2023-03-18 LAB — LIPID PANEL
Cholesterol: 206 mg/dL — ABNORMAL HIGH (ref 0–200)
HDL: 57.7 mg/dL (ref >39.00)
LDL Cholesterol: 120 mg/dL — ABNORMAL HIGH (ref 0–99)
NonHDL: 148.59
Total CHOL/HDL Ratio: 4
Triglycerides: 142 mg/dL (ref 0.0–149.0)
VLDL: 28.4 mg/dL (ref 0.0–40.0)

## 2023-03-18 LAB — TSH: TSH: 2.43 u[IU]/mL (ref 0.35–5.50)

## 2023-03-18 LAB — VITAMIN D 25 HYDROXY (VIT D DEFICIENCY, FRACTURES): VITD: 16.23 ng/mL — ABNORMAL LOW (ref 30.00–100.00)

## 2023-03-18 LAB — HEMOGLOBIN A1C: Hgb A1c MFr Bld: 6.2 % (ref 4.6–6.5)

## 2023-03-19 ENCOUNTER — Telehealth: Payer: Self-pay

## 2023-03-19 NOTE — Telephone Encounter (Signed)
Called pt using language services line agent Jerolyn Center # [phone] [facility] left a voicemail asking pt to CB in regards to lab results.

## 2023-05-07 ENCOUNTER — Ambulatory Visit: Payer: Commercial Managed Care - PPO | Admitting: Urology

## 2023-05-07 ENCOUNTER — Encounter: Payer: Self-pay | Admitting: Urology

## 2023-05-07 VITALS — BP 126/87 | HR 87 | Ht 64.0 in | Wt 174.0 lb

## 2023-05-07 DIAGNOSIS — N393 Stress incontinence (female) (male): Secondary | ICD-10-CM | POA: Diagnosis not present

## 2023-05-07 DIAGNOSIS — N390 Urinary tract infection, site not specified: Secondary | ICD-10-CM | POA: Diagnosis not present

## 2023-05-07 LAB — MICROSCOPIC EXAMINATION: Epithelial Cells (non renal): >10 /HPF — AB (ref 0–10)

## 2023-05-07 LAB — URINALYSIS, COMPLETE
Bilirubin, UA: NEGATIVE
Glucose, UA: NEGATIVE
Ketones, UA: NEGATIVE
Nitrite, UA: NEGATIVE
Protein,UA: NEGATIVE
Specific Gravity, UA: 1.02 (ref 1.005–1.030)
Urobilinogen, Ur: 0.2 mg/dL (ref 0.2–1.0)
pH, UA: 5.5 (ref 5.0–7.5)

## 2023-05-07 MED ORDER — NITROFURANTOIN MONOHYD MACRO 100 MG PO CAPS: 100.0000 mg | ORAL_CAPSULE | Freq: Two times a day (BID) | ORAL | 0 refills | Status: DC

## 2023-05-07 NOTE — Patient Instructions (Signed)
 D-mannose

## 2023-05-07 NOTE — Progress Notes (Signed)
 I, Maysun Anabel Bene, acting as a scribe for Riki Altes, MD., have documented all relevant documentation on the behalf of Riki Altes, MD, as directed by Riki Altes, MD while in the presence of Riki Altes, MD.  05/07/2023 11:15 AM   Stephanie Fischer 19-Sep-1980 [id number]  Referring provider: Bethanie Dicker, NP [address] [place],  [place] [address]  Chief Complaint  Patient presents with   Recurrent UTI    HPI: Stephanie Fischer is a 43 y.o. female referred for evaluation of recurrent urinary tract infections.  3-4 year history of recurrent urinary tract infections.  Typical UTI symptoms include urinary frequency, urgency, and dysuria. Her symptoms resolve with antibiotic therapy and then she typically estimates she gets infections every 2-3 months.  States she is gets urinary tract infections when traveling or using public restrooms and feels they are also stress-related.  On review of Epic, she has had several urine cultures which have grown mixed flora. She had 1 positive urine culture in 2022 for E. coli, and in November 2024 for beta hemolytic strep. Is sexually active and does occasionally note onset within 24 hours of intercourse. No febrile UTI or pyelonephritis.  She has had several recent CTs which have not shown urinary tract calculi or genitourinary abnormalities.  She is currently asymptomatic complaining of dysuria, frequency and urgency.  She also has a bothersome stress urinary incontinence with loss of urine with coughing, sneezing and bending. She wore pads when at work. She has had 2 previous children, however they were c-section.    PMH: Past Medical History:  Diagnosis Date   Gestational diabetes    Gestational diabetes    Headache     Surgical History: Past Surgical History:  Procedure Laterality Date   APPENDECTOMY     CESAREAN SECTION     LIPOMA EXCISION Bilateral 01/14/2017   Procedure: EXCISION LIPOMA;   Surgeon: Earline Mayotte, MD;  Location: [facility] ORS;  Service: General;  Laterality: Bilateral;    Home Medications:  Allergies as of 05/07/2023   No Known Allergies      Medication List        Accurate as of May 07, 2023 11:15 AM. If you have any questions, ask your nurse or doctor.          cetirizine 10 MG tablet Commonly known as: ZYRTEC Take 1 tablet (10 mg total) by mouth daily.   diphenhydrAMINE 25 MG tablet Commonly known as: Benadryl Allergy Take 1 tablet (25 mg total) by mouth every 6 (six) hours as needed for itching.   nitrofurantoin (macrocrystal-monohydrate) 100 MG capsule Commonly known as: MACROBID Take 1 capsule (100 mg total) by mouth 2 (two) times daily. Started by: Riki Altes   norethindrone-ethinyl estradiol 1-20 MG-MCG tablet Commonly known as: LOESTRIN Take 1 tablet by mouth daily.        Allergies: No Known Allergies  Social History:  reports that she has never smoked. She has never used smokeless tobacco. She reports that she does not drink alcohol and does not use drugs.   Physical Exam: BP 126/87   Pulse 87   Ht 5\' 4"  (1.626 m)   Wt 174 lb (78.9 kg)   BMI 29.87 kg/m   Constitutional:  Alert and oriented, No acute distress. HEENT: [place] AT Respiratory: Normal respiratory effort, no increased work of breathing. Psychiatric: Normal mood and affect.   Urinalysis Dipstick 1+ blood/trace leukocytes, microscopy 11-30 WBC/3-10 RBC, >10 epis.  Assessment & Plan:    1. Urinary tract infection She does not meet the AUA criteria for recurrent urinary tract infections in women, based on culture documentation.  Currently symptomatic with pyuria, though does have significant squamous epithelial cells; urine culture ordered, Based on symptoms, will start Macrobid 100mg  twice daily, empirically.  Once complete, I have recommended a trial of post-quotal prophylaxis and will await culture results prior to sending an Rx. We discussed use  of Cranberry and D-mannose for possible UTI prevention. She was instructed to call for acute UTI symptoms, and will schedule a 3 month follow-up.   2. Stress Urinary Incontinence Bothersome stress incontinence. Discussed pelvic floor physical therapy; patient interested in a referral.  I have reviewed the above documentation for accuracy and completeness, and I agree with the above.   Riki Altes, MD  [facility] [address], [address] [place], [place] [address] [phone]

## 2023-05-11 LAB — CULTURE, URINE COMPREHENSIVE

## 2023-05-12 ENCOUNTER — Other Ambulatory Visit: Payer: Self-pay | Admitting: Urology

## 2023-05-12 MED ORDER — NITROFURANTOIN MACROCRYSTAL 50 MG PO CAPS: ORAL_CAPSULE | ORAL | 0 refills | Status: DC

## 2023-06-10 ENCOUNTER — Ambulatory Visit: Admitting: Certified Nurse Midwife

## 2023-06-25 ENCOUNTER — Encounter: Payer: Self-pay | Admitting: Nurse Practitioner

## 2023-06-25 ENCOUNTER — Ambulatory Visit: Payer: Commercial Managed Care - PPO | Admitting: Nurse Practitioner

## 2023-06-25 VITALS — BP 102/78 | HR 83 | Temp 97.9°F | Ht 64.0 in | Wt 174.6 lb

## 2023-06-25 DIAGNOSIS — R7303 Prediabetes: Secondary | ICD-10-CM | POA: Insufficient documentation

## 2023-06-25 DIAGNOSIS — N912 Amenorrhea, unspecified: Secondary | ICD-10-CM | POA: Diagnosis not present

## 2023-06-25 DIAGNOSIS — R102 Pelvic and perineal pain: Secondary | ICD-10-CM | POA: Diagnosis not present

## 2023-06-25 DIAGNOSIS — E559 Vitamin D deficiency, unspecified: Secondary | ICD-10-CM

## 2023-06-25 DIAGNOSIS — E669 Obesity, unspecified: Secondary | ICD-10-CM

## 2023-06-25 DIAGNOSIS — E785 Hyperlipidemia, unspecified: Secondary | ICD-10-CM | POA: Insufficient documentation

## 2023-06-25 LAB — HCG, QUANTITATIVE, PREGNANCY: Quantitative HCG: 0.85 m[IU]/mL

## 2023-06-25 NOTE — Progress Notes (Signed)
 Bluford Burkitt, NP-C Phone: [phone]  Stephanie Fischer is a 43 y.o. female who presents today for left side pain.   Discussed the use of AI scribe software for clinical note transcription with the patient, who gave verbal consent to proceed.  History of Present Illness   Stephanie Fischer is a 43 year old female who presents with left-sided lower abdominal/pelvic cramping and concerns about a possible pregnancy.  She experiences intermittent left-sided abdominal cramping, sometimes exacerbated by walking or using the bathroom. No current bleeding or constipation, and bowel movements are normal. She has a history of ovarian cysts on both sides and previously required an ultrasound to evaluate for cysts.  She recently stopped taking birth control after 28 days and is experiencing a delay in her menstrual cycle, with her period being two weeks late. Multiple home pregnancy tests have been negative. She is anxious about the possibility of being pregnant, noting the absence of typical pregnancy symptoms experienced in previous pregnancies. She has a history of using various forms of birth control, including an IUD, which was removed due to complications, and currently uses birth control pills. She reports a history of irregular periods and difficulty with birth control methods.  She is concerned about her weight and has been informed that her A1c level is at the upper level of prediabetes. She wants to lose more than 25 pounds, citing knee problems and difficulty walking as motivating factors. She mentions working long hours and attending school, which impacts her ability to manage her weight.  She reports not taking any vitamin D  supplements despite low vitamin D  levels. She experiences bone pain, particularly upon waking, and has attempted to alleviate this with milk, which has not been effective.      Social History   Tobacco Use  Smoking Status Never  Smokeless Tobacco Never     Current Outpatient Medications on File Prior to Visit  Medication Sig Dispense Refill   cetirizine  (ZYRTEC ) 10 MG tablet Take 1 tablet (10 mg total) by mouth daily. 30 tablet 11   diphenhydrAMINE  (BENADRYL  ALLERGY) 25 MG tablet Take 1 tablet (25 mg total) by mouth every 6 (six) hours as needed for itching. 30 tablet 0   norethindrone -ethinyl estradiol  (LOESTRIN) 1-20 MG-MCG tablet Take 1 tablet by mouth daily. 84 tablet 3   No current facility-administered medications on file prior to visit.     ROS see history of present illness  Objective  Physical Exam Vitals:   06/25/23 1321  BP: 102/78  Pulse: 83  Temp: 97.9 F (36.6 C)  SpO2: 96%    BP Readings from Last 3 Encounters:  06/25/23 102/78  05/07/23 126/87  03/17/23 116/70   Wt Readings from Last 3 Encounters:  06/25/23 174 lb 9.6 oz (79.2 kg)  05/07/23 174 lb (78.9 kg)  03/17/23 171 lb 6.4 oz (77.7 kg)    Physical Exam Constitutional:      General: She is not in acute distress.    Appearance: Normal appearance.  HENT:     Head: Normocephalic.  Cardiovascular:     Rate and Rhythm: Normal rate and regular rhythm.     Heart sounds: Normal heart sounds.  Pulmonary:     Effort: Pulmonary effort is normal.     Breath sounds: Normal breath sounds.  Abdominal:     General: Abdomen is flat. Bowel sounds are normal.     Palpations: Abdomen is soft.     Tenderness: There is abdominal tenderness in the left lower quadrant.  Skin:    General: Skin is warm and dry.  Neurological:     General: No focal deficit present.     Mental Status: She is alert.  Psychiatric:        Mood and Affect: Mood normal.        Behavior: Behavior normal.      Assessment/Plan: Please see individual problem list.  Amenorrhea Assessment & Plan: Irregular menstruation may be related to birth control use. Approximately 2 weeks late. Home pregnancy tests are negative. POC test in office negative. Confirm with blood work and  schedule a transvaginal ultrasound to evaluate ovaries for cysts.  Orders: -     hCG, quantitative, pregnancy -     US  PELVIC COMPLETE WITH TRANSVAGINAL; Future  Pelvic pain Assessment & Plan: She experiences intermittent left-sided pelvic cramping. A history of ovarian cysts may contribute to symptoms. Schedule a transvaginal ultrasound to evaluate for ovarian cysts.  Orders: -     US  PELVIC COMPLETE WITH TRANSVAGINAL; Future  Obesity (BMI 30-39.9) Assessment & Plan: She aims to lose over 25 pounds. Weight loss medications were discussed, emphasizing diet and exercise. Pending hcg results. If negative, we will start the patient on Wegovy. Counseled on the risk of pancreatitis and gallbladder disease. Discussed the risk of nausea. Advised to discontinue the Zepbound and contact us  immediately if they develop abdominal pain. If they develop excessive nausea they will contact us  right away. I discussed that medullary thyroid  cancer has been seen in rats studies. The patient confirmed no personal or family history of thyroid  cancer, parathyroid cancer, or adrenal gland cancer. Discussed that we thus far have not seen medullary thyroid  cancer result from use of this type of medication in humans. Advised to monitor the thyroid  area and contact us  for any lumps, swelling, trouble swallowing, or any other changes in this area.  Discussed goal weight loss of 1 to 2 pounds a week while on this medication. Discussed the importance of healthy diet, exercise and lifestyle modifications even with this medication. She will follow up in 6 weeks.   Prediabetes Assessment & Plan: Her A1c of 6.2 indicates prediabetes. The importance of diet and exercise in management was discussed.   Vitamin D  deficiency -     Vitamin D  (Ergocalciferol ); Take 1 capsule (50,000 Units total) by mouth every 7 (seven) days.  Dispense: 13 capsule; Refill: 1    Return in about 6 weeks (around 08/06/2023) for Follow up.   Bluford Burkitt, NP-C [facility]

## 2023-06-28 ENCOUNTER — Telehealth: Payer: Self-pay

## 2023-06-28 NOTE — Telephone Encounter (Signed)
 Copied from CRM [id number]. Topic: Clinical - Lab/Test Results >> Jun 28, 2023 12:43 PM Stephanie Fischer wrote: Reason for CRM: Patient is calling to get the blood test results for her pregnancy test. Patient would like someone to give her a call back today if possible before she starts taking any medication.

## 2023-06-28 NOTE — Telephone Encounter (Signed)
 Tried to call pt with results vm left to cb and mychart msg sent

## 2023-06-29 ENCOUNTER — Ambulatory Visit
Admission: RE | Admit: 2023-06-29 | Discharge: 2023-06-29 | Disposition: A | Discharge status: Home / Self Care | Source: Ambulatory Visit | Attending: Nurse Practitioner | Admitting: Nurse Practitioner

## 2023-06-29 DIAGNOSIS — R102 Pelvic and perineal pain: Secondary | ICD-10-CM | POA: Diagnosis not present

## 2023-06-29 DIAGNOSIS — N912 Amenorrhea, unspecified: Secondary | ICD-10-CM | POA: Insufficient documentation

## 2023-06-29 DIAGNOSIS — D259 Leiomyoma of uterus, unspecified: Secondary | ICD-10-CM | POA: Diagnosis not present

## 2023-07-06 ENCOUNTER — Telehealth: Payer: Self-pay

## 2023-07-06 ENCOUNTER — Encounter: Payer: Self-pay | Admitting: Nurse Practitioner

## 2023-07-06 ENCOUNTER — Other Ambulatory Visit: Payer: Self-pay | Admitting: Nurse Practitioner

## 2023-07-06 DIAGNOSIS — E669 Obesity, unspecified: Secondary | ICD-10-CM

## 2023-07-06 MED ORDER — WEGOVY 0.25 MG/0.5ML ~~LOC~~ SOAJ: 0.2500 mg | SUBCUTANEOUS | 0 refills | Status: DC

## 2023-07-06 MED ORDER — VITAMIN D (ERGOCALCIFEROL) 1.25 MG (50000 UNIT) PO CAPS: 50000.0000 [IU] | ORAL_CAPSULE | ORAL | 1 refills | Status: AC

## 2023-07-06 MED ORDER — WEGOVY 0.5 MG/0.5ML ~~LOC~~ SOAJ: 0.5000 mg | SUBCUTANEOUS | 0 refills | Status: DC

## 2023-07-06 NOTE — Telephone Encounter (Signed)
 PA needed for Wegovy   Pt will have a secondary insurance added today and would like it done against both insurances

## 2023-07-06 NOTE — Assessment & Plan Note (Signed)
 She experiences intermittent left-sided pelvic cramping. A history of ovarian cysts may contribute to symptoms. Schedule a transvaginal ultrasound to evaluate for ovarian cysts.

## 2023-07-06 NOTE — Assessment & Plan Note (Signed)
 Her A1c of 6.2 indicates prediabetes. The importance of diet and exercise in management was discussed.

## 2023-07-06 NOTE — Assessment & Plan Note (Signed)
 Irregular menstruation may be related to birth control use. Approximately 2 weeks late. Home pregnancy tests are negative. POC test in office negative. Confirm with blood work and schedule a transvaginal ultrasound to evaluate ovaries for cysts.

## 2023-07-06 NOTE — Assessment & Plan Note (Signed)
 She aims to lose over 25 pounds. Weight loss medications were discussed, emphasizing diet and exercise. Pending hcg results. If negative, we will start the patient on Wegovy. Counseled on the risk of pancreatitis and gallbladder disease. Discussed the risk of nausea. Advised to discontinue the Zepbound and contact us  immediately if they develop abdominal pain. If they develop excessive nausea they will contact us  right away. I discussed that medullary thyroid  cancer has been seen in rats studies. The patient confirmed no personal or family history of thyroid  cancer, parathyroid cancer, or adrenal gland cancer. Discussed that we thus far have not seen medullary thyroid  cancer result from use of this type of medication in humans. Advised to monitor the thyroid  area and contact us  for any lumps, swelling, trouble swallowing, or any other changes in this area.  Discussed goal weight loss of 1 to 2 pounds a week while on this medication. Discussed the importance of healthy diet, exercise and lifestyle modifications even with this medication. She will follow up in 6 weeks.

## 2023-07-07 ENCOUNTER — Other Ambulatory Visit (HOSPITAL_COMMUNITY): Payer: Self-pay

## 2023-07-07 ENCOUNTER — Telehealth: Payer: Self-pay

## 2023-07-07 NOTE — Telephone Encounter (Signed)
 Pharmacy Patient Advocate Encounter   Received notification from Onbase that prior authorization for [facility] 0.25MG /0.5ML auto-injectors is required/requested.   Insurance verification completed.   The patient is insured through [employer] [facility] .   Per test claim: PA required; PA submitted to above mentioned insurance via CoverMyMeds Key/confirmation #/EOC [id number] Status is pending

## 2023-07-07 NOTE — Telephone Encounter (Signed)
 Pt informed that we will update her when the pharmacy tem updates us  on the status of the PA

## 2023-07-07 NOTE — Telephone Encounter (Signed)
 PA request has been Submitted. New Encounter has been or will be created for follow up. For additional info see Pharmacy Prior Auth telephone encounter from 05/07.

## 2023-07-07 NOTE — Telephone Encounter (Unsigned)
 Copied from CRM [id number]. Topic: Clinical - Medication Prior Auth >> Jul 07, 2023  3:34 PM Stephanie Fischer wrote: Reason for CRM: Patient is calling for prior approval for her Semaglutide-Weight Management (WEGOVY) 0.25 MG/0.5ML SOAJ , states she went to pick up medicine and pharmacy needs approval from doctor.  Serena Dana [phone]

## 2023-07-08 ENCOUNTER — Other Ambulatory Visit (HOSPITAL_COMMUNITY): Payer: Self-pay

## 2023-07-08 NOTE — Telephone Encounter (Signed)
 Pharmacy Patient Advocate Encounter  Received notification from [employer] [facility] that Prior Authorization for [facility] 0.25MG /0.5ML auto-injectors has been DENIED.  See denial reason below. No denial letter attached in CMM. Will attach denial letter to Media tab once received.   PA #/Case ID/Reference #: [id number]

## 2023-07-08 NOTE — Telephone Encounter (Signed)
 Pt has been informed and given the phone number to healthy weight and wellness [phone]

## 2023-07-09 ENCOUNTER — Ambulatory Visit
Admission: RE | Admit: 2023-07-09 | Discharge: 2023-07-09 | Disposition: A | Discharge status: Home / Self Care | Source: Ambulatory Visit | Attending: Nurse Practitioner

## 2023-07-09 DIAGNOSIS — Z1231 Encounter for screening mammogram for malignant neoplasm of breast: Secondary | ICD-10-CM | POA: Insufficient documentation

## 2023-07-14 ENCOUNTER — Other Ambulatory Visit: Payer: Self-pay | Admitting: Nurse Practitioner

## 2023-07-14 DIAGNOSIS — R928 Other abnormal and inconclusive findings on diagnostic imaging of breast: Secondary | ICD-10-CM

## 2023-07-16 ENCOUNTER — Encounter: Payer: Self-pay | Admitting: Certified Nurse Midwife

## 2023-07-16 ENCOUNTER — Other Ambulatory Visit (HOSPITAL_COMMUNITY)
Admission: RE | Admit: 2023-07-16 | Discharge: 2023-07-16 | Disposition: A | Discharge status: Home / Self Care | Source: Ambulatory Visit | Attending: Certified Nurse Midwife | Admitting: Certified Nurse Midwife

## 2023-07-16 ENCOUNTER — Ambulatory Visit

## 2023-07-16 ENCOUNTER — Ambulatory Visit (INDEPENDENT_AMBULATORY_CARE_PROVIDER_SITE_OTHER): Admitting: Certified Nurse Midwife

## 2023-07-16 VITALS — BP 112/76 | HR 75 | Ht 64.0 in | Wt 173.2 lb

## 2023-07-16 DIAGNOSIS — R4586 Emotional lability: Secondary | ICD-10-CM | POA: Insufficient documentation

## 2023-07-16 DIAGNOSIS — R102 Pelvic and perineal pain: Secondary | ICD-10-CM

## 2023-07-16 DIAGNOSIS — R232 Flushing: Secondary | ICD-10-CM | POA: Diagnosis not present

## 2023-07-16 DIAGNOSIS — Z124 Encounter for screening for malignant neoplasm of cervix: Secondary | ICD-10-CM

## 2023-07-16 DIAGNOSIS — N941 Unspecified dyspareunia: Secondary | ICD-10-CM

## 2023-07-16 DIAGNOSIS — N912 Amenorrhea, unspecified: Secondary | ICD-10-CM

## 2023-07-16 DIAGNOSIS — R635 Abnormal weight gain: Secondary | ICD-10-CM | POA: Insufficient documentation

## 2023-07-16 NOTE — Progress Notes (Addendum)
 GYN ENCOUNTER NOTE  Subjective:       Stephanie Fischer is a 43 y.o. G41P2002 female is here for gynecologic evaluation of the following issues:  1. Pt has had pelvic pain and heavy periods for the past several years. She has tried multiple forms of birth control to help and has failed them all. She recently was on OCP but stopped 3 months ago due to weight gain. She has not had a cycle since stopping her pill. She is now having hot flashes , mood changes, weight gain, and sleep disturbance. She continues to have pelvic pain. She has a known small fibroid . U/s completed this past April notes no significant change in size.  She is wants to be done with birth control and desires a hysterectomy.     Gynecologic History No LMP recorded (within months). (Menstrual status: Irregular Periods). Contraception: none Last Pap: 08/17/16. Results were: normal per pt  Last mammogram: 07/16/2023. Results were: pending  Obstetric History OB History  Gravida Para Term Preterm AB Living  2 2 2   2   SAB IAB Ectopic Multiple Live Births      2    # Outcome Date GA Lbr Len/2nd Weight Sex Type Anes PTL Lv  2 Term 12/29/11   5 lb (2.268 kg)  CS-Unspec  N LIV  1 Term 11/08/01   5 lb 8.2 oz (2.5 kg) M CS-Unspec  N LIV    Obstetric Comments  1st Menstrual Cycle:   13  1st Pregnancy:  14    Past Medical History:  Diagnosis Date   Gestational diabetes    Gestational diabetes    Headache     Past Surgical History:  Procedure Laterality Date   APPENDECTOMY     CESAREAN SECTION     LIPOMA EXCISION Bilateral 01/14/2017   Procedure: EXCISION LIPOMA;  Surgeon: Marshall Skeeter, MD;  Location: [facility] ORS;  Service: General;  Laterality: Bilateral;    Current Outpatient Medications on File Prior to Visit  Medication Sig Dispense Refill   cetirizine  (ZYRTEC ) 10 MG tablet Take 1 tablet (10 mg total) by mouth daily. 30 tablet 11   diphenhydrAMINE  (BENADRYL  ALLERGY) 25 MG tablet Take 1 tablet (25 mg total)  by mouth every 6 (six) hours as needed for itching. 30 tablet 0   Semaglutide -Weight Management (WEGOVY ) 0.5 MG/0.5ML SOAJ Inject 0.5 mg into the skin once a week. 2 mL 0   Vitamin D , Ergocalciferol , (DRISDOL ) 1.25 MG (50000 UNIT) CAPS capsule Take 1 capsule (50,000 Units total) by mouth every 7 (seven) days. 13 capsule 1   norethindrone -ethinyl estradiol  (LOESTRIN) 1-20 MG-MCG tablet Take 1 tablet by mouth daily. 84 tablet 3   Semaglutide -Weight Management (WEGOVY ) 0.25 MG/0.5ML SOAJ Inject 0.25 mg into the skin once a week. 2 mL 0   No current facility-administered medications on file prior to visit.    No Known Allergies  Social History   Socioeconomic History   Marital status: Legally Separated    Spouse name: Not on file   Number of children: Not on file   Years of education: Not on file   Highest education level: Not on file  Occupational History   Not on file  Tobacco Use   Smoking status: Never   Smokeless tobacco: Never  Vaping Use   Vaping status: Never Used  Substance and Sexual Activity   Alcohol use: No   Drug use: No   Sexual activity: Yes    Birth control/protection: None  Other  Topics Concern   Not on file  Social History Narrative   Not on file   Social Drivers of Health   Financial Resource Strain: Not on file  Food Insecurity: Not on file  Transportation Needs: Not on file  Physical Activity: Not on file  Stress: Not on file  Social Connections: Not on file  Intimate Partner Violence: Not on file    Family History  Problem Relation Age of Onset   Breast cancer Neg Hx     The following portions of the patient's history were reviewed and updated as appropriate: allergies, current medications, past family history, past medical history, past social history, past surgical history and problem list.  Review of Systems Review of Systems - Negative except as mentioned in HPI Review of Systems - General ROS: negative for - chills, fatigue, fever, ,  malaise or night sweats. Positive hot flashes Hematological and Lymphatic ROS: negative for - bleeding problems or swollen lymph nodes Gastrointestinal ROS: negative for - abdominal pain, blood in stools, change in bowel habits and nausea/vomiting. Positive weight gain Musculoskeletal ROS: negative for - joint pain, muscle pain or muscular weakness Genito-Urinary ROS: negative for -dysmenorrhea, dyspareunia, dysuria, genital discharge, genital ulcers, hematuria, incontinence, irregular/heavy menses, nocturia. Positive for  pelvic pain, mood changes, sleep disturbance,  change in menstrual cycle,   Objective:   BP 112/76   Pulse 75   Ht 5' 4 (1.626 m)   Wt 173 lb 3.2 oz (78.6 kg)   LMP  (Within Months)   BMI 29.73 kg/m  CONSTITUTIONAL: Well-developed, well-nourished female in no acute distress.  HENT:  Normocephalic, atraumatic.  NECK: Normal range of motion, supple, no masses.  Normal thyroid .  SKIN: Skin is warm and dry. No rash noted. Not diaphoretic. No erythema. No pallor. NEUROLGIC: Alert and oriented to person, place, and time. PSYCHIATRIC: Normal mood and affect. Normal behavior. Normal judgment and thought content. CARDIOVASCULAR:Not Examined RESPIRATORY: Not Examined BREASTS: Not Examined ABDOMEN: Soft, non distended; Non tender.  No Organomegaly. PELVIC:  External Genitalia: Normal  BUS: Normal  Vagina: Normal  Cervix: Normal  Uterus: Normal size, shape,consistency, mobile pap completed, contact bleeding.   Adnexa: Normal  RV: Normal   Bladder: Nontender MUSCULOSKELETAL: Normal range of motion. No tenderness.  No cyanosis, clubbing, or edema.   Narrative & Impression  CLINICAL DATA:  Pelvic pain, amenorrhea for 6 weeks.   EXAM: TRANSABDOMINAL AND TRANSVAGINAL ULTRASOUND OF PELVIS   TECHNIQUE: Both transabdominal and transvaginal ultrasound examinations of the pelvis were performed. Transabdominal technique was performed for global imaging of the pelvis  including uterus, ovaries, adnexal regions, and pelvic cul-de-sac. It was necessary to proceed with endovaginal exam following the transabdominal exam to visualize the uterus, endometrium and bilateral ovaries.   COMPARISON:  Jul 13, 2022   FINDINGS: Uterus   Measurements: 8.5 x 3.3 x 4.7 cm = volume: 68.26 mL. There is a uterine fibroid in the fundus measuring 1.4 x 1.2 x 1.8 cm.   Endometrium   Thickness: 11.9 mm.  No focal abnormality visualized.   Right ovary   Measurements: 1.7 x 1 x 2 cm = volume: 2 mL. Normal appearance/no adnexal mass.   Left ovary   Measurements: 2.1 x 1.3 x 1.4 cm = volume: 2.04 mL. Normal appearance/no adnexal mass.   Other findings   No abnormal free fluid.   IMPRESSION: 1. No acute abnormality identified. 2. Uterine fibroid in the fundus measuring 1.4 x 1.2 x 1.8 cm.     Electronically  Signed   By: Anna Barnes M.D.   On: 06/30/2023 11:08    Assessment:   Pelvic pain  Absence of menstruation Hot flashes Mood changes Sleep disturbance Stress Screen cervical cancer Dyspareunia    Plan:   Discussed possible causes of pain including scar tissue given history of c section. Discussed given she stopped birth control only 3 months ago it may take some time to start her period back. Labs collected to evaluate for menopause. Discussed medication to start her cycle back should labs be negative. Orders placed for pelvic floor therapy for dyspareunia and pelvic pain. Will consult with MD for further recommendations. Pt is frustrated and state she wants to be done with this. She does not want any other children and does not want to take any other hormonal medications. She is interested in hysterectomy. Pt encouraged to take medication to help with sleep ( melatonin or Unisom). She is in agreement. Will follow up with pap results and lab work.   Alise Appl, CNM

## 2023-07-17 ENCOUNTER — Encounter: Payer: Self-pay | Admitting: Certified Nurse Midwife

## 2023-07-17 ENCOUNTER — Other Ambulatory Visit: Payer: Self-pay | Admitting: Certified Nurse Midwife

## 2023-07-17 DIAGNOSIS — R7989 Other specified abnormal findings of blood chemistry: Secondary | ICD-10-CM

## 2023-07-17 LAB — TSH+FREE T4
Free T4: 1.07 ng/dL (ref 0.82–1.77)
TSH: 5.23 u[IU]/mL — ABNORMAL HIGH (ref 0.450–4.500)

## 2023-07-17 LAB — CORTISOL: Cortisol: 8.8 ug/dL (ref 6.2–19.4)

## 2023-07-17 LAB — ESTRADIOL: Estradiol: 10.5 pg/mL

## 2023-07-17 LAB — FOLLICLE STIMULATING HORMONE: FSH: 53 m[IU]/mL

## 2023-07-17 NOTE — Progress Notes (Signed)
 Pt notified of lab results. Pt encouraged to schedule follow up appointment to discuss treatment options.   Alise Appl, CNM

## 2023-07-21 LAB — CYTOLOGY - PAP
Comment: NEGATIVE
Diagnosis: NEGATIVE
High risk HPV: NEGATIVE

## 2023-07-27 ENCOUNTER — Ambulatory Visit: Admitting: Nurse Practitioner

## 2023-07-28 ENCOUNTER — Ambulatory Visit
Admission: RE | Admit: 2023-07-28 | Discharge: 2023-07-28 | Disposition: A | Discharge status: Home / Self Care | Source: Ambulatory Visit | Attending: Nurse Practitioner | Admitting: Nurse Practitioner

## 2023-07-28 ENCOUNTER — Other Ambulatory Visit: Payer: Self-pay | Admitting: Nurse Practitioner

## 2023-07-28 ENCOUNTER — Ambulatory Visit
Admission: RE | Admit: 2023-07-28 | Discharge: 2023-07-28 | Discharge status: Home / Self Care | Source: Ambulatory Visit | Attending: Nurse Practitioner

## 2023-07-28 DIAGNOSIS — R928 Other abnormal and inconclusive findings on diagnostic imaging of breast: Secondary | ICD-10-CM

## 2023-08-05 ENCOUNTER — Telehealth: Payer: Self-pay

## 2023-08-05 NOTE — Telephone Encounter (Signed)
 Pt has an appt 08-06-23 @ 2:40 pm , persian interpreter is needed for appt

## 2023-08-06 ENCOUNTER — Ambulatory Visit: Admitting: Nurse Practitioner

## 2023-08-06 ENCOUNTER — Telehealth: Payer: Self-pay

## 2023-08-06 VITALS — BP 100/76 | HR 75 | Temp 98.0°F | Ht 64.0 in | Wt 167.4 lb

## 2023-08-06 DIAGNOSIS — I8393 Asymptomatic varicose veins of bilateral lower extremities: Secondary | ICD-10-CM | POA: Diagnosis not present

## 2023-08-06 DIAGNOSIS — R300 Dysuria: Secondary | ICD-10-CM | POA: Diagnosis not present

## 2023-08-06 DIAGNOSIS — R7989 Other specified abnormal findings of blood chemistry: Secondary | ICD-10-CM | POA: Diagnosis not present

## 2023-08-06 DIAGNOSIS — E669 Obesity, unspecified: Secondary | ICD-10-CM

## 2023-08-06 LAB — POC URINALSYSI DIPSTICK (AUTOMATED)
Bilirubin, UA: NEGATIVE
Blood, UA: NEGATIVE
Glucose, UA: NEGATIVE
Ketones, UA: 15 — AB
Leukocytes, UA: NEGATIVE
Nitrite, UA: NEGATIVE
Protein, UA: NEGATIVE
Spec Grav, UA: >=1.03 — AB (ref 1.010–1.025)
Urobilinogen, UA: 0.2 U/dL
pH, UA: 5.5 (ref 5.0–8.0)

## 2023-08-06 MED ORDER — WEGOVY 0.5 MG/0.5ML ~~LOC~~ SOAJ: 0.5000 mg | SUBCUTANEOUS | 0 refills | Status: AC

## 2023-08-06 NOTE — Progress Notes (Signed)
 Bluford Burkitt, NP-C Phone: [phone]  Stephanie Fischer is a 43 y.o. female who presents today for follow up.   Discussed the use of AI scribe software for clinical note transcription with the patient, who gave verbal consent to proceed.  History of Present Illness   Stephanie Fischer is a 43 year old female who presents for a six-week follow-up on weight management with Wegovy .  She has lost six pounds since starting Wegovy  six weeks ago. She completed a dose of 0.25 mg last Thursday and plans to increase to 0.5 mg. She experiences no nausea or other side effects from the medication. Her diet consists of one meal a day, focusing on healthy foods such as chicken breast, steak, beans, spinach, and lettuce, with no sweets or carbohydrates. She feels full and does not crave sweet foods. She exercises three days a week, walking for 30 minutes and using workout equipment, which she feels is aiding her weight loss and overall well-being.  She feels very thirsty and is drinking a lot of water to stay hydrated. She is concerned about her thyroid  levels, which were slightly elevated at 5.2, above the normal range of 4.5. An upcoming appointment is scheduled to recheck her thyroid  levels.  She has noticed visible veins in her legs, which are not painful. She wears compression socks and mentions a family history of similar issues, as her mother also experienced visible veins.  She reports a burning sensation during urination that started today. Her urine test showed no blood, protein, nitrates, or leukocytes, though it was slightly cloudy and yellow.      Social History   Tobacco Use  Smoking Status Never  Smokeless Tobacco Never    Current Outpatient Medications on File Prior to Visit  Medication Sig Dispense Refill   cetirizine  (ZYRTEC ) 10 MG tablet Take 1 tablet (10 mg total) by mouth daily. 30 tablet 11   diphenhydrAMINE  (BENADRYL  ALLERGY) 25 MG tablet Take 1 tablet (25 mg total) by  mouth every 6 (six) hours as needed for itching. 30 tablet 0   Vitamin D , Ergocalciferol , (DRISDOL ) 1.25 MG (50000 UNIT) CAPS capsule Take 1 capsule (50,000 Units total) by mouth every 7 (seven) days. 13 capsule 1   No current facility-administered medications on file prior to visit.     ROS see history of present illness  Objective  Physical Exam Vitals:   08/06/23 1503  BP: 100/76  Pulse: 75  Temp: 98 F (36.7 C)  SpO2: 95%    BP Readings from Last 3 Encounters:  08/06/23 100/76  07/16/23 112/76  06/25/23 102/78   Wt Readings from Last 3 Encounters:  08/06/23 167 lb 6.4 oz (75.9 kg)  07/16/23 173 lb 3.2 oz (78.6 kg)  06/25/23 174 lb 9.6 oz (79.2 kg)    Physical Exam Constitutional:      General: She is not in acute distress.    Appearance: Normal appearance.  HENT:     Head: Normocephalic.   Cardiovascular:     Rate and Rhythm: Normal rate and regular rhythm.     Heart sounds: Normal heart sounds.  Pulmonary:     Effort: Pulmonary effort is normal.     Breath sounds: Normal breath sounds.   Skin:    General: Skin is warm and dry.   Neurological:     General: No focal deficit present.     Mental Status: She is alert.   Psychiatric:        Mood and Affect: Mood normal.  Behavior: Behavior normal.      Assessment/Plan: Please see individual problem list.  Dysuria Assessment & Plan: She reports dysuria, but urinalysis shows no infection. Slightly cloudy urine is possibly due to mild dehydration. Ensure adequate hydration by increasing water intake.  Orders: -     POCT Urinalysis Dipstick (Automated)  Obesity (BMI 30-39.9) Assessment & Plan: She is on Wegovy  for weight loss, experiencing no side effects, and has lost six pounds. Her goal is to lose 25-30 pounds through exercise and diet, and she is willing to cover medication costs. Increase Wegovy  dose to 0.5 mg for four weeks. Continue regular exercise and a healthy diet. Plan a follow-up  appointment in one month to assess weight and determine if further dose adjustment is needed.  Orders: -     Wegovy ; Inject 0.5 mg into the skin once a week.  Dispense: 2 mL; Refill: 0  Elevated TSH Assessment & Plan: TSH level is mildly elevated at 5.2, without significant symptoms. Recheck TSH level in one month. Monitor for symptoms such as changes in hair, skin, nails, heart palpitations, and temperature regulation.    Asymptomatic varicose veins of both lower extremities Assessment & Plan: Visible leg veins are present without pain or significant symptoms. Advise wearing compression socks if standing for long periods.        Return in about 4 weeks (around 09/03/2023) for Follow up.   Bluford Burkitt, NP-C [facility]

## 2023-08-06 NOTE — Telephone Encounter (Signed)
 Patient saw Bluford Burkitt, NP, today and Lorice Roof would like to see her again in four weeks.  We do not have available appointments in four weeks, so I scheduled patient for a follow-up appointment on 09/17/2023 per her schedule and Kacy's.  Patient states she was supposed to follow-up in four weeks because she will need to have a new prescription for her Wegovy .  Patient states she is concerned because she does not want to run out of her medication.  Please call.

## 2023-08-06 NOTE — Telephone Encounter (Signed)
Med was sent in

## 2023-08-13 ENCOUNTER — Other Ambulatory Visit

## 2023-08-13 ENCOUNTER — Encounter

## 2023-08-13 ENCOUNTER — Ambulatory Visit: Admitting: Urology

## 2023-08-16 ENCOUNTER — Ambulatory Visit: Admitting: Certified Nurse Midwife

## 2023-08-16 ENCOUNTER — Encounter: Payer: Self-pay | Admitting: Certified Nurse Midwife

## 2023-08-16 ENCOUNTER — Ambulatory Visit (INDEPENDENT_AMBULATORY_CARE_PROVIDER_SITE_OTHER): Admitting: Certified Nurse Midwife

## 2023-08-16 VITALS — BP 104/72 | HR 77 | Wt 167.2 lb

## 2023-08-16 DIAGNOSIS — I8393 Asymptomatic varicose veins of bilateral lower extremities: Secondary | ICD-10-CM | POA: Insufficient documentation

## 2023-08-16 DIAGNOSIS — R7989 Other specified abnormal findings of blood chemistry: Secondary | ICD-10-CM | POA: Diagnosis not present

## 2023-08-16 DIAGNOSIS — Z780 Asymptomatic menopausal state: Secondary | ICD-10-CM | POA: Diagnosis not present

## 2023-08-16 DIAGNOSIS — R300 Dysuria: Secondary | ICD-10-CM | POA: Insufficient documentation

## 2023-08-16 MED ORDER — CLIMARA PRO 0.045-0.015 MG/DAY TD PTWK: 1.0000 | MEDICATED_PATCH | TRANSDERMAL | 12 refills | Status: AC

## 2023-08-16 NOTE — Assessment & Plan Note (Signed)
 She reports dysuria, but urinalysis shows no infection. Slightly cloudy urine is possibly due to mild dehydration. Ensure adequate hydration by increasing water intake.

## 2023-08-16 NOTE — Assessment & Plan Note (Signed)
 Visible leg veins are present without pain or significant symptoms. Advise wearing compression socks if standing for long periods.

## 2023-08-16 NOTE — Progress Notes (Signed)
 OB/GYN CONFERENCE NOTE:  Subjective:       Stephanie Fischer is a 43 y.o. G42P2002 female who presents for a conference appointment. Follow up with lab results.      Gynecologic History No LMP recorded. (Menstrual status: Irregular Periods). Contraception: none  Obstetric History OB History  Gravida Para Term Preterm AB Living  2 2 2   2   SAB IAB Ectopic Multiple Live Births      2    # Outcome Date GA Lbr Len/2nd Weight Sex Type Anes PTL Lv  2 Term 12/29/11   5 lb (2.268 kg)  CS-Unspec  N LIV  1 Term 11/08/01   5 lb 8.2 oz (2.5 kg) M CS-Unspec  N LIV    Obstetric Comments  1st Menstrual Cycle:   13  1st Pregnancy:  14    Past Medical History:  Diagnosis Date   Gestational diabetes    Gestational diabetes    Headache     Past Surgical History:  Procedure Laterality Date   APPENDECTOMY     CESAREAN SECTION     LIPOMA EXCISION Bilateral 01/14/2017   Procedure: EXCISION LIPOMA;  Surgeon: Marshall Skeeter, MD;  Location: [facility] ORS;  Service: General;  Laterality: Bilateral;    Current Outpatient Medications on File Prior to Visit  Medication Sig Dispense Refill   Semaglutide -Weight Management (WEGOVY ) 0.5 MG/0.5ML SOAJ Inject 0.5 mg into the skin once a week. 2 mL 0   Vitamin D , Ergocalciferol , (DRISDOL ) 1.25 MG (50000 UNIT) CAPS capsule Take 1 capsule (50,000 Units total) by mouth every 7 (seven) days. 13 capsule 1   No current facility-administered medications on file prior to visit.    No Known Allergies  Social History   Socioeconomic History   Marital status: Legally Separated    Spouse name: Not on file   Number of children: Not on file   Years of education: Not on file   Highest education level: Not on file  Occupational History   Not on file  Tobacco Use   Smoking status: Never   Smokeless tobacco: Never  Vaping Use   Vaping status: Never Used  Substance and Sexual Activity   Alcohol use: No   Drug use: No   Sexual activity: Yes    Birth  control/protection: None  Other Topics Concern   Not on file  Social History Narrative   Not on file   Social Drivers of Health   Financial Resource Strain: Not on file  Food Insecurity: Not on file  Transportation Needs: Not on file  Physical Activity: Not on file  Stress: Not on file  Social Connections: Not on file  Intimate Partner Violence: Not on file    Family History  Problem Relation Age of Onset   Breast cancer Neg Hx     The following portions of the patient's history were reviewed and updated as appropriate: allergies, current medications, past family history, past medical history, past social history, past surgical history and problem list.  Review of Systems A comprehensive review of systems was negative except for: Constitutional: positive for night sweats and hot flashes, insomnia, mood changes, weight gain    Objective:   There were no vitals taken for this visit.   Assessment/Plan:   Patient Active Problem List   Diagnosis Date Noted   Dysuria 08/16/2023   Elevated TSH 08/16/2023   Asymptomatic varicose veins of both lower extremities 08/16/2023   Hot flashes 07/16/2023   Mood change 07/16/2023  Dyspareunia in female 07/16/2023   Weight gain 07/16/2023   Prediabetes 06/25/2023   Hyperlipidemia 06/25/2023   Vitamin D  deficiency 06/25/2023   Amenorrhea 06/25/2023   Pelvic pain 06/25/2023   Recurrent UTI 03/17/2023   Obesity (BMI 30-39.9) 03/17/2023   Contact dermatitis 03/17/2023   PTSD (post-traumatic stress disorder) 03/17/2023   Lipoma of back 12/30/2016       Pt lab results confirm diagnosis of early menopause. Discussed HRT . Reviewed risks and benefits of use. She denies any contraindications to use. She states she is having a biopsy on right breast for  8 mm focal asymmetry in upper outer right breast. Low suspicion for malignancy likely represents a benign cyst. Discussed cessation of HRT if biopsy were positive. She is in agreement.  Repeat Tsh/T4 completed today per her request ( she is fasting).   Time: 15 minutes discussing menopause, HRT, and self help measures.  Return to Clinic: PRN,   [place]  Pro patch ordered for HRT.    Alise Appl, CNM [facility]

## 2023-08-16 NOTE — Assessment & Plan Note (Signed)
 TSH level is mildly elevated at 5.2, without significant symptoms. Recheck TSH level in one month. Monitor for symptoms such as changes in hair, skin, nails, heart palpitations, and temperature regulation.

## 2023-08-16 NOTE — Assessment & Plan Note (Signed)
 She is on Wegovy  for weight loss, experiencing no side effects, and has lost six pounds. Her goal is to lose 25-30 pounds through exercise and diet, and she is willing to cover medication costs. Increase Wegovy  dose to 0.5 mg for four weeks. Continue regular exercise and a healthy diet. Plan a follow-up appointment in one month to assess weight and determine if further dose adjustment is needed.

## 2023-08-17 LAB — TSH+FREE T4
Free T4: 1.12 ng/dL (ref 0.82–1.77)
TSH: 2.42 u[IU]/mL (ref 0.450–4.500)

## 2023-08-18 ENCOUNTER — Ambulatory Visit
Admission: RE | Admit: 2023-08-18 | Discharge: 2023-08-18 | Disposition: A | Discharge status: Home / Self Care | Source: Ambulatory Visit | Attending: Nurse Practitioner | Admitting: Nurse Practitioner

## 2023-08-18 ENCOUNTER — Encounter: Payer: Self-pay | Admitting: Certified Nurse Midwife

## 2023-08-18 ENCOUNTER — Ambulatory Visit
Admission: RE | Admit: 2023-08-18 | Discharge: 2023-08-18 | Disposition: A | Discharge status: Home / Self Care | Source: Ambulatory Visit | Attending: Nurse Practitioner

## 2023-08-18 DIAGNOSIS — R928 Other abnormal and inconclusive findings on diagnostic imaging of breast: Secondary | ICD-10-CM | POA: Insufficient documentation

## 2023-08-18 DIAGNOSIS — N6001 Solitary cyst of right breast: Secondary | ICD-10-CM | POA: Insufficient documentation

## 2023-08-18 MED ORDER — LIDOCAINE HCL (PF) 1 % IJ SOLN
5.0000 mL | Freq: Once | INTRAMUSCULAR | Status: AC
  Administered 2023-08-18: 5 mL
  Filled 2023-08-18: qty 5

## 2023-08-26 ENCOUNTER — Ambulatory Visit: Admitting: Certified Nurse Midwife

## 2023-09-06 ENCOUNTER — Other Ambulatory Visit: Payer: Self-pay

## 2023-09-06 ENCOUNTER — Emergency Department
Admission: EM | Admit: 2023-09-06 | Discharge: 2023-09-06 | Discharge status: Against Medical Advice | Source: Ambulatory Visit | Attending: Emergency Medicine | Admitting: Emergency Medicine

## 2023-09-06 ENCOUNTER — Encounter: Payer: Self-pay | Admitting: Emergency Medicine

## 2023-09-06 DIAGNOSIS — H9201 Otalgia, right ear: Secondary | ICD-10-CM | POA: Insufficient documentation

## 2023-09-06 DIAGNOSIS — Z5321 Procedure and treatment not carried out due to patient leaving prior to being seen by health care provider: Secondary | ICD-10-CM | POA: Insufficient documentation

## 2023-09-06 DIAGNOSIS — I959 Hypotension, unspecified: Secondary | ICD-10-CM | POA: Insufficient documentation

## 2023-09-06 LAB — CBC WITH DIFFERENTIAL/PLATELET
Abs Immature Granulocytes: 0.02 K/uL (ref 0.00–0.07)
Basophils Absolute: 0 K/uL (ref 0.0–0.1)
Basophils Relative: 0 %
Eosinophils Absolute: 0.1 K/uL (ref 0.0–0.5)
Eosinophils Relative: 1 %
HCT: 41.9 % (ref 36.0–46.0)
Hemoglobin: 13.9 g/dL (ref 12.0–15.0)
Immature Granulocytes: 0 %
Lymphocytes Relative: 32 %
Lymphs Abs: 2.1 K/uL (ref 0.7–4.0)
MCH: 28.1 pg (ref 26.0–34.0)
MCHC: 33.2 g/dL (ref 30.0–36.0)
MCV: 84.6 fL (ref 80.0–100.0)
Monocytes Absolute: 0.5 K/uL (ref 0.1–1.0)
Monocytes Relative: 7 %
Neutro Abs: 4 K/uL (ref 1.7–7.7)
Neutrophils Relative %: 60 %
Platelets: 330 K/uL (ref 150–400)
RBC: 4.95 MIL/uL (ref 3.87–5.11)
RDW: 12.8 % (ref 11.5–15.5)
WBC: 6.7 K/uL (ref 4.0–10.5)
nRBC: 0 % (ref 0.0–0.2)

## 2023-09-06 LAB — BASIC METABOLIC PANEL WITH GFR
Anion gap: 8 (ref 5–15)
BUN: 13 mg/dL (ref 6–20)
CO2: 26 mmol/L (ref 22–32)
Calcium: 8.9 mg/dL (ref 8.9–10.3)
Chloride: 105 mmol/L (ref 98–111)
Creatinine, Ser: 0.72 mg/dL (ref 0.44–1.00)
GFR, Estimated: >60 mL/min (ref >60)
Glucose, Bld: 87 mg/dL (ref 70–99)
Potassium: 4.3 mmol/L (ref 3.5–5.1)
Sodium: 139 mmol/L (ref 135–145)

## 2023-09-06 NOTE — ED Notes (Signed)
 Pt to first nurse desk stating that she would like to leave. Pt signed AMA form. Pt ambulatory without difficulty or assistance. Pt is in NAD.

## 2023-09-06 NOTE — ED Triage Notes (Signed)
 Patient to ED from [facility] for hypotension- 86/60. PT reports initially being there due to right ear pain. Pt reports she has been feeling tired.

## 2023-09-08 DIAGNOSIS — H6121 Impacted cerumen, right ear: Secondary | ICD-10-CM | POA: Diagnosis not present

## 2023-09-08 DIAGNOSIS — H66001 Acute suppurative otitis media without spontaneous rupture of ear drum, right ear: Secondary | ICD-10-CM | POA: Diagnosis not present

## 2023-09-10 ENCOUNTER — Other Ambulatory Visit: Payer: Self-pay | Admitting: Nurse Practitioner

## 2023-09-10 ENCOUNTER — Telehealth: Payer: Self-pay | Admitting: Nurse Practitioner

## 2023-09-10 DIAGNOSIS — E669 Obesity, unspecified: Secondary | ICD-10-CM

## 2023-09-10 MED ORDER — WEGOVY 1 MG/0.5ML ~~LOC~~ SOAJ
1.0000 mg | SUBCUTANEOUS | 0 refills | Status: DC
Start: 2023-09-10 — End: 2023-09-29

## 2023-09-10 NOTE — Telephone Encounter (Signed)
 Refill sent.

## 2023-09-10 NOTE — Telephone Encounter (Signed)
 Copied from CRM [id number]. Topic: Clinical - Medication Refill >> Sep 10, 2023 12:13 PM Berneda F wrote: Medication: Semaglutide -Weight Management (WEGOVY ) 1.0 MG/0.5ML SOAJ  Pt states previous dose was 0.5 MG and this one would be 1.0 MG. She wanted a sooner appt to get this refilled but Gretel has no sooner appts. Are we able to fill enough to her next appt?  Has the patient contacted their pharmacy? Yes (Agent: If no, request that the patient contact the pharmacy for the refill. If patient does not wish to contact the pharmacy document the reason why and proceed with request.) (Agent: If yes, when and what did the pharmacy advise?)  This is the patient's preferred pharmacy:  [id number] - [place], [place] - [address] [place] [address] Phone: [phone] Fax: [phone]  Is this the correct pharmacy for this prescription? Yes If no, delete pharmacy and type the correct one.   Has the prescription been filled recently? No  Is the patient out of the medication? Yes needs the new one today please, if at all postible  Has the patient been seen for an appointment in the last year OR does the patient have an upcoming appointment? Yes  Can we respond through MyChart? Yes  Agent: Please be advised that Rx refills may take up to 3 business days. We ask that you follow-up with your pharmacy.

## 2023-09-17 ENCOUNTER — Ambulatory Visit: Admitting: Nurse Practitioner

## 2023-09-21 DIAGNOSIS — H6692 Otitis media, unspecified, left ear: Secondary | ICD-10-CM | POA: Diagnosis not present

## 2023-09-21 DIAGNOSIS — J029 Acute pharyngitis, unspecified: Secondary | ICD-10-CM | POA: Diagnosis not present

## 2023-09-23 ENCOUNTER — Other Ambulatory Visit (HOSPITAL_COMMUNITY): Payer: Self-pay

## 2023-09-23 ENCOUNTER — Telehealth: Payer: Self-pay | Admitting: Pharmacy Technician

## 2023-09-23 NOTE — Telephone Encounter (Signed)
 Pharmacy Patient Advocate Encounter   Received notification from RX Request Messages that prior authorization for Wegovy  1mg  is required/requested.   Insurance verification completed.   The patient is insured through [facility] .   Per test claim: PA required; PA submitted to above mentioned insurance via CoverMyMeds Key/confirmation #/EOC [id number] Status is pending

## 2023-09-23 NOTE — Telephone Encounter (Signed)
 PA request has been Received. New Encounter has been or will be created for follow up. For additional info see Pharmacy Prior Auth telephone encounter from 09/23/23.

## 2023-09-27 ENCOUNTER — Other Ambulatory Visit (HOSPITAL_COMMUNITY): Payer: Self-pay

## 2023-09-29 ENCOUNTER — Other Ambulatory Visit (HOSPITAL_COMMUNITY): Payer: Self-pay

## 2023-10-04 ENCOUNTER — Other Ambulatory Visit (HOSPITAL_COMMUNITY): Payer: Self-pay

## 2023-10-04 NOTE — Telephone Encounter (Signed)
 Pharmacy Patient Advocate Encounter  Received notification from [facility] that Prior Authorization for Wegovy  has been DENIED.  It appears I answered one of the questions incorrectly by mistake. Initating new PA.    New PA Key:  [id number] - [id number]

## 2023-10-05 ENCOUNTER — Other Ambulatory Visit (HOSPITAL_COMMUNITY): Payer: Self-pay

## 2023-10-05 NOTE — Telephone Encounter (Signed)
 Pharmacy Patient Advocate Encounter  Received notification from [facility] that Prior Authorization for Wegovy  1mg   has been APPROVED from 10/05/23 to 05/03/24. Ran test claim, Copay is $24.99. This test claim was processed through [facility]- copay amounts may vary at other pharmacies due to pharmacy/plan contracts, or as the patient moves through the different stages of their insurance plan.   PA #/Case ID/Reference #: [id number]

## 2023-10-09 ENCOUNTER — Other Ambulatory Visit: Payer: Self-pay

## 2023-10-09 DIAGNOSIS — R112 Nausea with vomiting, unspecified: Secondary | ICD-10-CM | POA: Insufficient documentation

## 2023-10-09 DIAGNOSIS — N3000 Acute cystitis without hematuria: Secondary | ICD-10-CM | POA: Diagnosis not present

## 2023-10-09 DIAGNOSIS — R300 Dysuria: Secondary | ICD-10-CM | POA: Diagnosis not present

## 2023-10-09 LAB — URINALYSIS, ROUTINE W REFLEX MICROSCOPIC
Bacteria, UA: NONE SEEN
Bilirubin Urine: NEGATIVE
Glucose, UA: NEGATIVE mg/dL
Hgb urine dipstick: NEGATIVE
Ketones, ur: NEGATIVE mg/dL
Nitrite: POSITIVE — AB
Protein, ur: NEGATIVE mg/dL
Specific Gravity, Urine: 1.027 (ref 1.005–1.030)
WBC, UA: >50 WBC/hpf (ref 0–5)
pH: 6 (ref 5.0–8.0)

## 2023-10-09 NOTE — ED Triage Notes (Signed)
 Pt presents via POV with dysuria and headache starting today.

## 2023-10-10 ENCOUNTER — Emergency Department
Admission: EM | Admit: 2023-10-10 | Discharge: 2023-10-10 | Disposition: A | Discharge status: Home / Self Care | Attending: Emergency Medicine | Admitting: Emergency Medicine

## 2023-10-10 DIAGNOSIS — N3000 Acute cystitis without hematuria: Secondary | ICD-10-CM | POA: Diagnosis not present

## 2023-10-10 DIAGNOSIS — R300 Dysuria: Secondary | ICD-10-CM

## 2023-10-10 LAB — POC URINE PREG, ED: Preg Test, Ur: NEGATIVE

## 2023-10-10 MED ORDER — PHENAZOPYRIDINE HCL 95 MG PO TABS: 190.0000 mg | ORAL_TABLET | Freq: Three times a day (TID) | ORAL | 0 refills | Status: AC | PRN

## 2023-10-10 MED ORDER — CEFADROXIL 500 MG PO CAPS: 500.0000 mg | ORAL_CAPSULE | Freq: Two times a day (BID) | ORAL | 0 refills | Status: AC

## 2023-10-10 MED ORDER — ONDANSETRON 4 MG PO TBDP
ORAL_TABLET | ORAL | 0 refills | Status: AC
Start: 2023-10-10 — End: ?

## 2023-10-10 MED ORDER — CEFTRIAXONE SODIUM 1 G IJ SOLR
1.0000 g | Freq: Once | INTRAMUSCULAR | Status: AC
  Administered 2023-10-10: 1 g via INTRAMUSCULAR
  Filled 2023-10-10: qty 10

## 2023-10-10 MED ORDER — PHENAZOPYRIDINE HCL 200 MG PO TABS
200.0000 mg | ORAL_TABLET | Freq: Once | ORAL | Status: AC
  Administered 2023-10-10: 200 mg via ORAL
  Filled 2023-10-10: qty 1

## 2023-10-10 MED ORDER — ONDANSETRON 4 MG PO TBDP
4.0000 mg | ORAL_TABLET | Freq: Once | ORAL | Status: AC
  Administered 2023-10-10: 4 mg via ORAL
  Filled 2023-10-10: qty 1

## 2023-10-10 MED ORDER — LIDOCAINE HCL (PF) 1 % IJ SOLN
1.0000 mL | Freq: Once | INTRAMUSCULAR | Status: AC
  Administered 2023-10-10: 2.1 mL
  Filled 2023-10-10: qty 5

## 2023-10-10 NOTE — Discharge Instructions (Addendum)
 We gave you an injection of medication that should start your treatment and be a very good antibiotic for you.  We also wrote you a prescription for antibiotics (cefadroxil  ) that you should take until they are gone.  You have a prescription for Pyridium  which should help with the discomfort when you urinate, but please do not take additional over-the-counter medications such as Azo if you are taking this medicine.  We also wrote you a prescription for nausea medicine if you continue to feel sick to your stomach.  Please follow-up with your regular doctor and return to the emergency department if you develop new or worsening symptoms that concern you.

## 2023-10-10 NOTE — ED Provider Notes (Signed)
 [facility] Provider Note    Event Date/Time   First MD Initiated Contact with Patient 10/10/23 [phone]     (approximate)   History   Dysuria  The patient and/or family speak(s) Farsi natively.  They understand they have the right to the use of a hospital interpreter, however at this time they prefer to speak directly with me in [place].  They know that they can ask for an interpreter at any time.   HPI Stephanie Fischer is a 43 y.o. female who presents for evaluation of severe burning dysuria and increased urinary frequency.  She says she gets frequent urinary tract infections and this feels the same.  She has also had some nausea and 2 episodes of vomiting and her head has been hurting as well.  No fever of which she is aware.  No chest pain no shortness of breath.       Physical Exam   Triage Vital Signs: ED Triage Vitals  Encounter Vitals Group     BP 10/09/23 2220 118/85     Girls Systolic BP Percentile --      Girls Diastolic BP Percentile --      Boys Systolic BP Percentile --      Boys Diastolic BP Percentile --      Pulse Rate 10/09/23 2220 81     Resp 10/09/23 2220 16     Temp 10/09/23 2220 98.5 F (36.9 C)     Temp Source 10/09/23 2220 Oral     SpO2 10/09/23 2220 98 %     Weight --      Height --      Head Circumference --      Peak Flow --      Pain Score 10/09/23 2221 9     Pain Loc --      Pain Education --      Exclude from Growth Chart --     Most recent vital signs: Vitals:   10/09/23 2220  BP: 118/85  Pulse: 81  Resp: 16  Temp: 98.5 F (36.9 C)  SpO2: 98%    General: Awake, no distress.  Generally well-appearing. CV:  Good peripheral perfusion.  Resp:  Normal effort. Speaking easily and comfortably, no accessory muscle usage nor intercostal retractions.   Abd:  No distention.  Soft.  Tender to palpation in the suprapubic region, otherwise reassuring with no guarding and no rebound.   ED Results / Procedures /  Treatments   Labs (all labs ordered are listed, but only abnormal results are displayed) Labs Reviewed  URINALYSIS, ROUTINE W REFLEX MICROSCOPIC - Abnormal; Notable for the following components:      Result Value   Color, Urine AMBER (*)    APPearance CLEAR (*)    Nitrite POSITIVE (*)    Leukocytes,Ua TRACE (*)    All other components within normal limits  URINE CULTURE  POC URINE PREG, ED       PROCEDURES:  Critical Care performed: No  Procedures    IMPRESSION / MDM / ASSESSMENT AND PLAN / ED COURSE  I reviewed the triage vital signs and the nursing notes.                              Differential diagnosis includes, but is not limited to, UTI, pyelonephritis, STD/PID/TOA.  Patient's presentation is most consistent with acute presentation with potential threat to life or bodily function.  Labs/studies  ordered: Urinalysis, urine culture, urine pregnancy test  Interventions/Medications given:  Medications  phenazopyridine  (PYRIDIUM ) tablet 200 mg (has no administration in time range)  cefTRIAXone  (ROCEPHIN ) injection 1 g (has no administration in time range)  lidocaine  (PF) (XYLOCAINE ) 1 % injection 1-2.1 mL (has no administration in time range)  ondansetron  (ZOFRAN -ODT) disintegrating tablet 4 mg (has no administration in time range)    (Note:  hospital course my include additional interventions and/or labs/studies not listed above.)   Reassuring vital signs, no sign of systemic infection.  Patient has a nitrite positive urinalysis consistent with infection.  I ordered a urine culture for future reference.  Patient is comfortable with empiric treatment.  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  I think that is reasonable and we will buy her some  time before she can get to the pharmacy tomorrow.  I wrote her prescriptions as listed below and counseled her not to use over-the-counter Azo if she is using the prescribed Pyridium .  She will follow-up with her regular primary care provider.  I gave my usual and customary return precautions.          FINAL CLINICAL IMPRESSION(S) / ED DIAGNOSES   Final diagnoses:  Acute cystitis without hematuria  Dysuria     Rx / DC Orders   ED Discharge Orders          Ordered    ondansetron  (ZOFRAN -ODT) 4 MG disintegrating tablet        10/10/23 0146    cefadroxil  (DURICEF) 500 MG capsule  2 times daily        10/10/23 0146    phenazopyridine  (PYRIDIUM ) 95 MG tablet  3 times daily with meals PRN        10/10/23 0146             Note:  This document was prepared using Dragon voice recognition software and may include unintentional dictation errors.   Gordan Huxley, MD 10/10/23 [phone]

## 2023-10-11 LAB — URINE CULTURE

## 2023-10-31 ENCOUNTER — Other Ambulatory Visit: Payer: Self-pay | Admitting: Nurse Practitioner

## 2023-10-31 DIAGNOSIS — E559 Vitamin D deficiency, unspecified: Secondary | ICD-10-CM

## 2023-11-25 ENCOUNTER — Ambulatory Visit: Attending: Certified Nurse Midwife | Admitting: Physical Therapy

## 2023-12-02 ENCOUNTER — Ambulatory Visit: Admitting: Physical Therapy

## 2023-12-09 ENCOUNTER — Ambulatory Visit: Admitting: Physical Therapy

## 2023-12-16 ENCOUNTER — Ambulatory Visit: Admitting: Physical Therapy

## 2023-12-23 ENCOUNTER — Ambulatory Visit: Admitting: Physical Therapy

## 2023-12-30 ENCOUNTER — Ambulatory Visit: Admitting: Physical Therapy

## 2024-01-05 ENCOUNTER — Ambulatory Visit: Admitting: Physical Therapy

## 2024-01-06 ENCOUNTER — Ambulatory Visit: Admitting: Physical Therapy

## 2024-01-13 ENCOUNTER — Ambulatory Visit: Admitting: Physical Therapy

## 2024-01-20 ENCOUNTER — Ambulatory Visit: Admitting: Physical Therapy
